# Patient Record
Sex: Male | Born: 1960 | Race: Black or African American | Hispanic: No | Marital: Married | Smoking: Former smoker
Health system: Southern US, Community
[De-identification: ages and names within clinical notes are randomized; demographics above are authoritative.]

## PROBLEM LIST (undated history)

## (undated) ENCOUNTER — Ambulatory Visit: Payer: BLUE CROSS/BLUE SHIELD | Admitting: Interventional Cardiology

## (undated) DIAGNOSIS — E7800 Pure hypercholesterolemia, unspecified: Secondary | ICD-10-CM

## (undated) DIAGNOSIS — K5900 Constipation, unspecified: Secondary | ICD-10-CM

## (undated) DIAGNOSIS — K635 Polyp of colon: Secondary | ICD-10-CM

## (undated) DIAGNOSIS — E119 Type 2 diabetes mellitus without complications: Secondary | ICD-10-CM

## (undated) DIAGNOSIS — I10 Essential (primary) hypertension: Secondary | ICD-10-CM

## (undated) HISTORY — PX: COLONOSCOPY: SHX174

## (undated) HISTORY — DX: Type 2 diabetes mellitus without complications: E11.9

## (undated) HISTORY — PX: CIRCUMCISION: SUR203

## (undated) HISTORY — DX: Essential (primary) hypertension: I10

---

## 2012-09-04 ENCOUNTER — Ambulatory Visit (INDEPENDENT_AMBULATORY_CARE_PROVIDER_SITE_OTHER): Payer: PRIVATE HEALTH INSURANCE | Admitting: Physician Assistant

## 2012-09-04 ENCOUNTER — Encounter: Payer: Self-pay | Admitting: Physician Assistant

## 2012-09-04 VITALS — BP 130/82 | HR 97 | Temp 97.3°F | Wt 152.0 lb

## 2012-09-04 DIAGNOSIS — E119 Type 2 diabetes mellitus without complications: Secondary | ICD-10-CM | POA: Insufficient documentation

## 2012-09-04 DIAGNOSIS — I10 Essential (primary) hypertension: Secondary | ICD-10-CM | POA: Insufficient documentation

## 2012-09-04 DIAGNOSIS — L0291 Cutaneous abscess, unspecified: Secondary | ICD-10-CM

## 2012-09-04 DIAGNOSIS — L0390 Cellulitis, unspecified: Secondary | ICD-10-CM

## 2012-09-04 MED ORDER — CEPHALEXIN 500 MG PO CAPS: 500.0000 mg | ORAL_CAPSULE | Freq: Two times a day (BID) | ORAL | Status: DC

## 2012-09-04 NOTE — Patient Instructions (Signed)
Wound Care Wound care helps prevent pain and infection.  You may need a tetanus shot if:  You cannot remember when you had your last tetanus shot.  You have never had a tetanus shot.  The injury broke your skin. If you need a tetanus shot and you choose not to have one, you may get tetanus. Sickness from tetanus can be serious. HOME CARE   Only take medicine as told by your doctor.  Clean the wound daily with mild soap and water.  Change any bandages (dressings) as told by your doctor.  Put medicated cream and a bandage on the wound as told by your doctor.  Change the bandage if it gets wet, dirty, or starts to smell.  Take showers. Do not take baths, swim, or do anything that puts your wound under water.  Rest and raise (elevate) the wound until the pain and puffiness (swelling) are better.  Keep all doctor visits as told. GET HELP RIGHT AWAY IF:   Yellowish-white fluid (pus) comes from the wound.  Medicine does not lessen your pain.  There is a red streak going away from the wound.  You have a fever. MAKE SURE YOU:   Understand these instructions.  Will watch your condition.  Will get help right away if you are not doing well or get worse. Document Released: 12/06/2007 Document Revised: 05/21/2011 Document Reviewed: 07/02/2010 ExitCare Patient Information 2014 [employer].  

## 2012-09-04 NOTE — Progress Notes (Signed)
Subjective:     Patient ID: Gene Wright, male   DOB: 1960/12/12, 52 y.o.   MRN: [id number]  HPI Pt with cyst behind the R ear Cyst has just started draining  Minimal pain to the site No hx of prev excision  Review of Systems  All other systems reviewed and are negative.       Objective:   Physical Exam  Nursing note and vitals reviewed. Draining lesion to the post R earlobe No pre-aur nodes No cerv nodes Ear canal nl     Assessment:     Abscess    Plan:     Wound care/S&S of infection reviewed Keflex rx F/U prn

## 2012-09-08 ENCOUNTER — Ambulatory Visit: Payer: Self-pay | Admitting: Physician Assistant

## 2012-12-23 ENCOUNTER — Ambulatory Visit: Payer: Managed Care, Other (non HMO) | Attending: Family Medicine | Admitting: Physical Therapy

## 2012-12-23 DIAGNOSIS — M545 Low back pain, unspecified: Secondary | ICD-10-CM | POA: Insufficient documentation

## 2012-12-23 DIAGNOSIS — R5381 Other malaise: Secondary | ICD-10-CM | POA: Insufficient documentation

## 2012-12-23 DIAGNOSIS — IMO0001 Reserved for inherently not codable concepts without codable children: Secondary | ICD-10-CM | POA: Insufficient documentation

## 2012-12-24 ENCOUNTER — Ambulatory Visit: Payer: Managed Care, Other (non HMO)

## 2012-12-31 ENCOUNTER — Ambulatory Visit: Payer: Managed Care, Other (non HMO)

## 2013-01-05 ENCOUNTER — Ambulatory Visit: Payer: Managed Care, Other (non HMO) | Admitting: Physical Therapy

## 2013-02-11 ENCOUNTER — Encounter (INDEPENDENT_AMBULATORY_CARE_PROVIDER_SITE_OTHER): Payer: Self-pay | Admitting: *Deleted

## 2013-02-26 ENCOUNTER — Encounter (INDEPENDENT_AMBULATORY_CARE_PROVIDER_SITE_OTHER): Payer: Self-pay | Admitting: Internal Medicine

## 2013-02-26 ENCOUNTER — Ambulatory Visit (INDEPENDENT_AMBULATORY_CARE_PROVIDER_SITE_OTHER): Payer: Managed Care, Other (non HMO) | Admitting: Internal Medicine

## 2013-02-26 VITALS — BP 122/84 | HR 84 | Temp 98.3°F | Ht 63.0 in | Wt 161.7 lb

## 2013-02-26 DIAGNOSIS — R1310 Dysphagia, unspecified: Secondary | ICD-10-CM

## 2013-02-26 DIAGNOSIS — K5900 Constipation, unspecified: Secondary | ICD-10-CM | POA: Insufficient documentation

## 2013-02-26 NOTE — Progress Notes (Addendum)
Subjective:     Patient ID: Gene Wright, male   DOB: August 19, 1960, 52 y.o.   MRN: [id number]  HPI Referred to our office by Dr Kipp Brood at [facility] in [place] for swallowing problems.   He also has epigastric tenderness and bloating.  He says he feels a 'lump" in his throat for a few weeks. No dysphagia.  No fever. He also has problems with constipation. He takes Miralax for constipation. He also drinks prune juice and coffee to have a BM daily. He says if he doesn't take the Miralax, prune juice and coffee, his abdomen will swell up "like a balloon". Symptoms for 9 months.  He also has some back pain, and has undergone physical therapy in [place] which did not help. No rectal bleeding or melena. He is scheduled for a colonoscopy with Dr. Elnoria Howard in [place]. Last colonoscopy was 4 yrs and he thinks he had a polyp. Appetite is okay. No weight loss.  No family hx colon cancer  Review of Systems CMP  No results found for this basename: na, k, cl, co2, glucose, bun, creatinine, calcium, prot, albumin, ast, alt, alkphos, bilitot, gfrnonaa, gfraa   Past Medical History  Diagnosis Date  . Hypertension   . Diabetes mellitus without complication    Current Outpatient Prescriptions on File Prior to Visit  Medication Sig Dispense Refill  . aspirin 81 MG tablet Take 81 mg by mouth daily.      Marland Kitchen lisinopril (PRINIVIL,ZESTRIL) 10 MG tablet Take 10 mg by mouth daily.      . metFORMIN (GLUCOPHAGE) 500 MG tablet Take 500 mg by mouth once.      . rosuvastatin (CRESTOR) 20 MG tablet Take 10 mg by mouth daily.        No current facility-administered medications on file prior to visit.   Past Medical History  Diagnosis Date  . Hypertension   . Diabetes mellitus without complication          Objective:   Physical Exam  Filed Vitals:   02/26/13 1100  BP: 122/84  Pulse: 84  Temp: 98.3 F (36.8 C)  Height: 5\' 3"  (1.6 m)  Weight: 161 lb 11.2 oz (73.347 kg)  Alert and oriented. Skin  warm and dry. Oral mucosa is moist.   . Sclera anicteric, conjunctivae is pink. Thyroid not enlarged. No cervical lymphadenopathy. Lungs clear. Heart regular rate and rhythm.  Abdomen is soft. Bowel sounds are positive. No hepatomegaly. No abdominal masses felt. No tenderness.  No edema to lower extremities. Patient is alert and oriented.      Assessment:   ?dysphagia. He says it feels like he has a lump in his espohagus when he swallows. Taking Nexium; Has not really seen any change. There really is no dysphagia. Constipation: Presently taking Miralax, Prune juice, and coffee which has helped.     Plan:    Pill esophagram.  Will get records for his last colonoscopy 4 yrs ago.

## 2013-02-26 NOTE — Patient Instructions (Signed)
Esophagram, Records from Dr. Elnoria Howard

## 2013-03-02 ENCOUNTER — Encounter (INDEPENDENT_AMBULATORY_CARE_PROVIDER_SITE_OTHER): Payer: Self-pay

## 2013-03-16 ENCOUNTER — Ambulatory Visit (HOSPITAL_COMMUNITY)
Admission: RE | Admit: 2013-03-16 | Discharge: 2013-03-16 | Disposition: A | Discharge status: Home / Self Care | Payer: PRIVATE HEALTH INSURANCE | Source: Ambulatory Visit | Attending: Internal Medicine | Admitting: Internal Medicine

## 2013-03-16 DIAGNOSIS — R1310 Dysphagia, unspecified: Secondary | ICD-10-CM | POA: Insufficient documentation

## 2013-03-17 ENCOUNTER — Telehealth (INDEPENDENT_AMBULATORY_CARE_PROVIDER_SITE_OTHER): Payer: Self-pay | Admitting: Internal Medicine

## 2013-03-18 NOTE — Telephone Encounter (Signed)
Opened in error

## 2013-06-16 ENCOUNTER — Encounter (INDEPENDENT_AMBULATORY_CARE_PROVIDER_SITE_OTHER): Payer: Self-pay | Admitting: *Deleted

## 2013-06-30 ENCOUNTER — Other Ambulatory Visit (INDEPENDENT_AMBULATORY_CARE_PROVIDER_SITE_OTHER): Payer: Self-pay | Admitting: *Deleted

## 2013-06-30 ENCOUNTER — Encounter (INDEPENDENT_AMBULATORY_CARE_PROVIDER_SITE_OTHER): Payer: Self-pay

## 2013-06-30 ENCOUNTER — Encounter (INDEPENDENT_AMBULATORY_CARE_PROVIDER_SITE_OTHER): Payer: Self-pay | Admitting: *Deleted

## 2013-06-30 ENCOUNTER — Telehealth (INDEPENDENT_AMBULATORY_CARE_PROVIDER_SITE_OTHER): Payer: Self-pay | Admitting: *Deleted

## 2013-06-30 DIAGNOSIS — Z800 Family history of malignant neoplasm of digestive organs: Secondary | ICD-10-CM

## 2013-06-30 DIAGNOSIS — Z1211 Encounter for screening for malignant neoplasm of colon: Secondary | ICD-10-CM

## 2013-06-30 DIAGNOSIS — Z86010 Personal history of colonic polyps: Secondary | ICD-10-CM

## 2013-06-30 MED ORDER — PEG-KCL-NACL-NASULF-NA ASC-C 100 G PO SOLR: 1.0000 | Freq: Once | ORAL | Status: DC

## 2013-06-30 NOTE — Telephone Encounter (Signed)
Patient needs movi prep 

## 2013-08-28 ENCOUNTER — Telehealth (INDEPENDENT_AMBULATORY_CARE_PROVIDER_SITE_OTHER): Payer: Self-pay | Admitting: *Deleted

## 2013-08-28 NOTE — Telephone Encounter (Signed)
  Procedure: tcs  Reason/Indication:  Hx polyps, fam hx colon ca  Has patient had this procedure before?  Yes, 5 years ago, scanned  If so, when, by whom and where?    Is there a family history of colon cancer?  Yes, father  Who?  What age when diagnosed?    Is patient diabetic?   yes      Does patient have prosthetic heart valve?  no  Do you have a pacemaker?  no  Has patient ever had endocarditis? no  Has patient had joint replacement within last 12 months?  no  Does patient tend to be constipated or take laxatives? ocassionally  Is patient on Coumadin, Plavix and/or Aspirin? yes  Medications: asa 81 mg prn, lisinopril 10 mg daily, metformin 500 mg daily (am)  Allergies: biaxin  Medication Adjustment: asa 2 days, hold metformin morning of procedure  Procedure date & time: 09/09/13 at 930

## 2013-08-31 ENCOUNTER — Encounter (HOSPITAL_COMMUNITY): Payer: Self-pay | Admitting: Pharmacy Technician

## 2013-08-31 NOTE — Telephone Encounter (Signed)
agree

## 2013-09-09 ENCOUNTER — Ambulatory Visit (HOSPITAL_COMMUNITY)
Admission: RE | Admit: 2013-09-09 | Discharge: 2013-09-09 | Disposition: A | Discharge status: Home / Self Care | Payer: PRIVATE HEALTH INSURANCE | Source: Ambulatory Visit | Attending: Internal Medicine | Admitting: Internal Medicine

## 2013-09-09 ENCOUNTER — Encounter (HOSPITAL_COMMUNITY): Payer: Self-pay | Admitting: *Deleted

## 2013-09-09 ENCOUNTER — Encounter (HOSPITAL_COMMUNITY)
Admission: RE | Disposition: A | Discharge status: Home / Self Care | Payer: Self-pay | Source: Ambulatory Visit | Attending: Internal Medicine

## 2013-09-09 DIAGNOSIS — Z7982 Long term (current) use of aspirin: Secondary | ICD-10-CM | POA: Diagnosis not present

## 2013-09-09 DIAGNOSIS — Z860100 Personal history of colon polyps, unspecified: Secondary | ICD-10-CM

## 2013-09-09 DIAGNOSIS — Z79899 Other long term (current) drug therapy: Secondary | ICD-10-CM | POA: Diagnosis not present

## 2013-09-09 DIAGNOSIS — K5900 Constipation, unspecified: Secondary | ICD-10-CM | POA: Diagnosis not present

## 2013-09-09 DIAGNOSIS — K5730 Diverticulosis of large intestine without perforation or abscess without bleeding: Secondary | ICD-10-CM

## 2013-09-09 DIAGNOSIS — Z09 Encounter for follow-up examination after completed treatment for conditions other than malignant neoplasm: Secondary | ICD-10-CM | POA: Diagnosis not present

## 2013-09-09 DIAGNOSIS — I10 Essential (primary) hypertension: Secondary | ICD-10-CM | POA: Insufficient documentation

## 2013-09-09 DIAGNOSIS — Z800 Family history of malignant neoplasm of digestive organs: Secondary | ICD-10-CM

## 2013-09-09 DIAGNOSIS — E119 Type 2 diabetes mellitus without complications: Secondary | ICD-10-CM | POA: Insufficient documentation

## 2013-09-09 DIAGNOSIS — F17200 Nicotine dependence, unspecified, uncomplicated: Secondary | ICD-10-CM | POA: Insufficient documentation

## 2013-09-09 DIAGNOSIS — R141 Gas pain: Secondary | ICD-10-CM | POA: Diagnosis not present

## 2013-09-09 DIAGNOSIS — R143 Flatulence: Secondary | ICD-10-CM

## 2013-09-09 DIAGNOSIS — E7800 Pure hypercholesterolemia, unspecified: Secondary | ICD-10-CM | POA: Insufficient documentation

## 2013-09-09 DIAGNOSIS — R142 Eructation: Secondary | ICD-10-CM

## 2013-09-09 DIAGNOSIS — K644 Residual hemorrhoidal skin tags: Secondary | ICD-10-CM

## 2013-09-09 HISTORY — DX: Polyp of colon: K63.5

## 2013-09-09 HISTORY — PX: COLONOSCOPY: SHX5424

## 2013-09-09 HISTORY — DX: Pure hypercholesterolemia, unspecified: E78.00

## 2013-09-09 HISTORY — DX: Constipation, unspecified: K59.00

## 2013-09-09 LAB — GLUCOSE, CAPILLARY: Glucose-Capillary: 109 mg/dL — ABNORMAL HIGH (ref 70–99)

## 2013-09-09 SURGERY — COLONOSCOPY
Anesthesia: Moderate Sedation

## 2013-09-09 MED ORDER — MEPERIDINE HCL 50 MG/ML IJ SOLN
INTRAMUSCULAR | Status: DC | PRN
  Administered 2013-09-09 (×2): 25 mg via INTRAVENOUS

## 2013-09-09 MED ORDER — MIDAZOLAM HCL 5 MG/5ML IJ SOLN
INTRAMUSCULAR | Status: AC
  Filled 2013-09-09: qty 10

## 2013-09-09 MED ORDER — MEPERIDINE HCL 50 MG/ML IJ SOLN
INTRAMUSCULAR | Status: AC
  Filled 2013-09-09: qty 1

## 2013-09-09 MED ORDER — STERILE WATER FOR IRRIGATION IR SOLN
Status: DC | PRN
  Administered 2013-09-09: 10:00:00

## 2013-09-09 MED ORDER — SODIUM CHLORIDE 0.9 % IV SOLN
INTRAVENOUS | Status: DC
  Administered 2013-09-09: 09:00:00 via INTRAVENOUS

## 2013-09-09 MED ORDER — MIDAZOLAM HCL 5 MG/5ML IJ SOLN
INTRAMUSCULAR | Status: DC | PRN
  Administered 2013-09-09: 2 mg via INTRAVENOUS
  Administered 2013-09-09: 1 mg via INTRAVENOUS
  Administered 2013-09-09: 2 mg via INTRAVENOUS

## 2013-09-09 NOTE — Op Note (Signed)
COLONOSCOPY PROCEDURE REPORT  PATIENT:  Gene RoachChristopher L Wright  MR#:  [id number] Birthdate:  [birth year], 53 y.o., male Endoscopist:  Dr. Malissa HippoNajeeb U. Rehman, MD Referred By:  Dr. Delorse LekBrent A. 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 He is returning for surveillance colonoscopy. He has chronic constipation responsive to MiraLax. Family history significant for colon carcinoma in paternal grandfather but he was in the 7170s at the time of diagnosis.  Informed Consent:  The procedure and risks were reviewed with the patient and informed consent was obtained.  Medications:  Demerol 50 mg IV Versed 5 mg IV  Description of procedure:  After a digital rectal exam was performed, that colonoscope was advanced from the anus through the rectum and colon to the area of the cecum, ileocecal valve and appendiceal orifice. The cecum was deeply intubated. These structures were well-seen and photographed for the record. From the level of the cecum and ileocecal valve, the scope was slowly and cautiously withdrawn. The mucosal surfaces were carefully surveyed utilizing scope tip to flexion to facilitate fold flattening as needed. The scope was pulled down into the rectum where a thorough exam including retroflexion was performed.  Findings:   Prep excellent. Few small diverticula noted at sigmoid colon. Normal rectal mucosa. Small hemorrhoids below the dentate line.   Therapeutic/Diagnostic Maneuvers Performed:   None  Complications:  none  Cecal Withdrawal Time:  10 minutes  Impression:  Examination performed to cecum. No evidence of recurrent polyps. Mild sigmoid colon diverticulosis. Small external hemorrhoids.  Recommendations:  Standard instructions given. High fiber diet. Consider next exam in 5 years.  REHMAN,NAJEEB U  09/09/2013 10:06 AM  CC: Dr.  Delorse LekBURNETT,BRENT A, MD & Dr. Bonnetta BarryNo ref. provider found

## 2013-09-09 NOTE — Discharge Instructions (Signed)
Resume usual medications and high fiber diet. No driving for 24 hours. Consider next exam in 5 years.  Colonoscopy, Care After Refer to this sheet in the next few weeks. These instructions provide you with information on caring for yourself after your procedure. Your health care provider may also give you more specific instructions. Your treatment has been planned according to current medical practices, but problems sometimes occur. Call your health care provider if you have any problems or questions after your procedure. WHAT TO EXPECT AFTER THE PROCEDURE  After your procedure, it is typical to have the following:  A small amount of blood in your stool.  Moderate amounts of gas and mild abdominal cramping or bloating. HOME CARE INSTRUCTIONS  Do not drive, operate machinery, or sign important documents for 24 hours.  You may shower and resume your regular physical activities, but move at a slower pace for the first 24 hours.  Take frequent rest periods for the first 24 hours.  Walk around or put a warm pack on your abdomen to help reduce abdominal cramping and bloating.  Drink enough fluids to keep your urine clear or pale yellow.  You may resume your normal diet as instructed by your health care provider. Avoid heavy or fried foods that are hard to digest.  Avoid drinking alcohol for 24 hours or as instructed by your health care provider.  Only take over-the-counter or prescription medicines as directed by your health care provider.  If a tissue sample (biopsy) was taken during your procedure:  Do not take aspirin or blood thinners for 7 days, or as instructed by your health care provider.  Do not drink alcohol for 7 days, or as instructed by your health care provider.  Eat soft foods for the first 24 hours. SEEK MEDICAL CARE IF: You have persistent spotting of blood in your stool 2-3 days after the procedure. SEEK IMMEDIATE MEDICAL CARE IF:  You have more than a small  spotting of blood in your stool.  You pass large blood clots in your stool.  Your abdomen is swollen (distended).  You have nausea or vomiting.  You have a fever.  You have increasing abdominal pain that is not relieved with medicine. Document Released: 10/11/2003 Document Revised: 12/17/2012 Document Reviewed: 11/03/2012 [facility] Patient Information 2015 [place], [place]. This information is not intended to replace advice given to you by your health care provider. Make sure you discuss any questions you have with your health care provider.  Diverticulosis Diverticulosis is the condition that develops when small pouches (diverticula) form in the wall of your colon. Your colon, or large intestine, is where water is absorbed and stool is formed. The pouches form when the inside layer of your colon pushes through weak spots in the outer layers of your colon. CAUSES  No one knows exactly what causes diverticulosis. RISK FACTORS  Being older than 50. Your risk for this condition increases with age. Diverticulosis is rare in people younger than 40 years. By age 76, almost everyone has it.  Eating a low-fiber diet.  Being frequently constipated.  Being overweight.  Not getting enough exercise.  Smoking.  Taking over-the-counter pain medicines, like aspirin and ibuprofen. SYMPTOMS  Most people with diverticulosis do not have symptoms. DIAGNOSIS  Because diverticulosis often has no symptoms, health care providers often discover the condition during an exam for other colon problems. In many cases, a health care provider will diagnose diverticulosis while using a flexible scope to examine the colon (colonoscopy). TREATMENT  If you have never developed an infection related to diverticulosis, you may not need treatment. If you have had an infection before, treatment may include:  Eating more fruits, vegetables, and grains.  Taking a fiber supplement.  Taking a live bacteria supplement  (probiotic).  Taking medicine to relax your colon. HOME CARE INSTRUCTIONS   Drink at least 6-8 glasses of water each day to prevent constipation.  Try not to strain when you have a bowel movement.  Keep all follow-up appointments. If you have had an infection before:  Increase the fiber in your diet as directed by your health care provider or dietitian.  Take a dietary fiber supplement if your health care provider approves.  Only take medicines as directed by your health care provider. SEEK MEDICAL CARE IF:   You have abdominal pain.  You have bloating.  You have cramps.  You have not gone to the bathroom in 3 days. SEEK IMMEDIATE MEDICAL CARE IF:   Your pain gets worse.  Yourbloating becomes very bad.  You have a fever or chills, and your symptoms suddenly get worse.  You begin vomiting.  You have bowel movements that are bloody or black. MAKE SURE YOU:  Understand these instructions.  Will watch your condition.  Will get help right away if you are not doing well or get worse. Document Released: 11/24/2003 Document Revised: 03/03/2013 Document Reviewed: 01/21/2013 [facility] Patient Information 2015 [place], [place]. This information is not intended to replace advice given to you by your health care provider. Make sure you discuss any questions you have with your health care provider.  High-Fiber Diet Fiber is found in fruits, vegetables, and grains. A high-fiber diet encourages the addition of more whole grains, legumes, fruits, and vegetables in your diet. The recommended amount of fiber for adult males is 38 g per day. For adult females, it is 25 g per day. Pregnant and lactating women should get 28 g of fiber per day. If you have a digestive or bowel problem, ask your caregiver for advice before adding high-fiber foods to your diet. Eat a variety of high-fiber foods instead of only a select few type of foods.  PURPOSE  To increase stool bulk.  To make bowel  movements more regular to prevent constipation.  To lower cholesterol.  To prevent overeating. WHEN IS THIS DIET USED?  It may be used if you have constipation and hemorrhoids.  It may be used if you have uncomplicated diverticulosis (intestine condition) and irritable bowel syndrome.  It may be used if you need help with weight management.  It may be used if you want to add it to your diet as a protective measure against atherosclerosis, diabetes, and cancer. SOURCES OF FIBER  Whole-grain breads and cereals.  Fruits, such as apples, oranges, bananas, berries, prunes, and pears.  Vegetables, such as green peas, carrots, sweet potatoes, beets, broccoli, cabbage, spinach, and artichokes.  Legumes, such split peas, soy, lentils.  Almonds. FIBER CONTENT IN FOODS Starches and Grains / Dietary Fiber (g)  Cheerios, 1 cup / 3 g  Corn Flakes cereal, 1 cup / 0.7 g  Rice crispy treat cereal, 1 cup / 0.3 g  Instant oatmeal (cooked),  cup / 2 g  Frosted wheat cereal, 1 cup / 5.1 g  Brown, long-grain rice (cooked), 1 cup / 3.5 g  White, long-grain rice (cooked), 1 cup / 0.6 g  Enriched macaroni (cooked), 1 cup / 2.5 g Legumes / Dietary Fiber (g)  Baked beans (canned,  plain, or vegetarian),  cup / 5.2 g  Kidney beans (canned),  cup / 6.8 g  Pinto beans (cooked),  cup / 5.5 g Breads and Crackers / Dietary Fiber (g)  Plain or honey graham crackers, 2 squares / 0.7 g  Saltine crackers, 3 squares / 0.3 g  Plain, salted pretzels, 10 pieces / 1.8 g  Whole-wheat bread, 1 slice / 1.9 g  White bread, 1 slice / 0.7 g  Raisin bread, 1 slice / 1.2 g  Plain bagel, 3 oz / 2 g  Flour tortilla, 1 oz / 0.9 g  Corn tortilla, 1 small / 1.5 g  Hamburger or hotdog bun, 1 small / 0.9 g Fruits / Dietary Fiber (g)  Apple with skin, 1 medium / 4.4 g  Sweetened applesauce,  cup / 1.5 g  Banana,  medium / 1.5 g  Grapes, 10 grapes / 0.4 g  Orange, 1 small / 2.3  g  Raisin, 1.5 oz / 1.6 g  Melon, 1 cup / 1.4 g Vegetables / Dietary Fiber (g)  Green beans (canned),  cup / 1.3 g  Carrots (cooked),  cup / 2.3 g  Broccoli (cooked),  cup / 2.8 g  Peas (cooked),  cup / 4.4 g  Mashed potatoes,  cup / 1.6 g  Lettuce, 1 cup / 0.5 g  Corn (canned),  cup / 1.6 g  Tomato,  cup / 1.1 g Document Released: 02/26/2005 Document Revised: 08/28/2011 Document Reviewed: 05/31/2011 Exit[employer] Patient Information 2015 [place], [place]. This information is not intended to replace advice given to you by your health care provider. Make sure you discuss any questions you have with your health care provider.

## 2013-09-09 NOTE — H&P (Signed)
Gene RoachChristopher L Hepp is an 53 y.o. male.   Chief Complaint: patient is here for colonoscopy. HPI: patient is [age] year old African male who had small sessile serrated adenoma removed in April 2010 and is here for surveillance colonoscopy. He denies rectal bleeding. He has constipation and bloating. His bowels move as long as he takes MiraLax . Family history significant for colon carcinoma in paternal grandfather but he was in the 8670s at the time of diagnosis. Patient does not have first degree relative with CRC or polyps.  Past Medical History  Diagnosis Date  . Hypertension   . Diabetes mellitus without complication   . Hypercholesteremia   . Constipation   . Colon polyps     Past Surgical History  Procedure Laterality Date  . Circumcision    . Colonoscopy      Family History  Problem Relation Age of Onset  . Coronary artery disease Father   . Diabetes Maternal Grandmother   . Diabetes Maternal Grandfather   . Diabetes Paternal Grandmother   . Cancer Paternal Grandfather     prostate  . Diabetes Paternal Grandfather   . Colon cancer Neg Hx    Social History:  reports that he has been smoking Cigars.  He does not have any smokeless tobacco history on file. He reports that he drinks about 8.4 ounces of alcohol per week. He reports that he does not use illicit drugs.  Allergies:  Allergies  Allergen Reactions  . Biaxin [Clarithromycin] Hives and Itching    Medications Prior to Admission  Medication Sig Dispense Refill  . aspirin 81 MG tablet Take 81 mg by mouth daily.      Marland Kitchen. glimepiride (AMARYL) 2 MG tablet Take 2 mg by mouth daily with breakfast.      . lisinopril (PRINIVIL,ZESTRIL) 10 MG tablet Take 10 mg by mouth daily.      . metFORMIN (GLUCOPHAGE) 500 MG tablet Take 500 mg by mouth daily.       . polyethylene glycol (MIRALAX / GLYCOLAX) packet Take 17 g by mouth daily.      . rosuvastatin (CRESTOR) 20 MG tablet Take 10 mg by mouth daily.         Results for orders  placed during the hospital encounter of 09/09/13 (from the past 48 hour(s))  GLUCOSE, CAPILLARY     Status: Abnormal   Collection Time    09/09/13  9:01 AM      Result Value Ref Range   Glucose-Capillary 109 (*) 70 - 99 mg/dL   Comment 1 Documented in Chart     No results found.  ROS  Blood pressure 135/92, pulse 86, temperature 98.5 F (36.9 C), temperature source Oral, resp. rate 18, height 5\' 4"  (1.626 m), weight 159 lb (72.122 kg), SpO2 94.00%. Physical Exam  Constitutional: He appears well-developed and well-nourished.  HENT:  Mouth/Throat: Oropharynx is clear and moist.  Eyes: Conjunctivae are normal. No scleral icterus.  Neck: No thyromegaly present.  Cardiovascular: Normal rate, regular rhythm and normal heart sounds.   Respiratory: Effort normal and breath sounds normal.  GI: Soft. He exhibits no distension and no mass. There is no tenderness.  Small umbilical hernia which is completely reducible  Musculoskeletal: He exhibits no edema.  Lymphadenopathy:    He has no cervical adenopathy.  Neurological: He is alert.  Skin: Skin is warm and dry.     Assessment/Plan History of sessile serrated adenoma or polyp. Surveillance colonoscopy.  Analicia Skibinski U 09/09/2013, 9:39 AM

## 2013-09-16 ENCOUNTER — Encounter (HOSPITAL_COMMUNITY): Payer: Self-pay | Admitting: Internal Medicine

## 2014-04-15 ENCOUNTER — Encounter: Payer: Self-pay | Admitting: Family Medicine

## 2014-04-15 ENCOUNTER — Ambulatory Visit (INDEPENDENT_AMBULATORY_CARE_PROVIDER_SITE_OTHER): Payer: PRIVATE HEALTH INSURANCE | Admitting: Family Medicine

## 2014-04-15 VITALS — BP 143/91 | HR 124 | Temp 97.4°F | Ht 63.0 in | Wt 166.2 lb

## 2014-04-15 DIAGNOSIS — N481 Balanitis: Secondary | ICD-10-CM

## 2014-04-15 MED ORDER — NYSTATIN 100000 UNIT/GM EX CREA: 1.0000 "application " | TOPICAL_CREAM | Freq: Two times a day (BID) | CUTANEOUS | Status: DC

## 2014-04-15 MED ORDER — FLUCONAZOLE 150 MG PO TABS: 150.0000 mg | ORAL_TABLET | Freq: Once | ORAL | Status: DC

## 2014-04-15 NOTE — Progress Notes (Signed)
   Subjective:    Patient ID: Gene Wright, male    DOB: 04/26/1960, 54 y.o.   MRN: [id number]  HPI Patient is c/o rash on his penis  Review of Systems  Constitutional: Negative for fever.  HENT: Negative for ear pain.   Eyes: Negative for discharge.  Respiratory: Negative for cough.   Cardiovascular: Negative for chest pain.  Gastrointestinal: Negative for abdominal distention.  Endocrine: Negative for polyuria.  Genitourinary: Negative for difficulty urinating.  Musculoskeletal: Negative for gait problem and neck pain.  Skin: Negative for color change and rash.  Neurological: Negative for speech difficulty and headaches.  Psychiatric/Behavioral: Negative for agitation.       Objective:    BP 143/91 mmHg  Pulse 124  Temp(Src) 97.4 F (36.3 C) (Oral)  Ht 5\' 3"  (1.6 m)  Wt 166 lb 3.2 oz (75.388 kg)  BMI 29.45 kg/m2 Physical Exam  Constitutional: He is oriented to person, place, and time. He appears well-developed and well-nourished.  HENT:  Head: Normocephalic and atraumatic.  Mouth/Throat: Oropharynx is clear and moist.  Eyes: Pupils are equal, round, and reactive to light.  Neck: Normal range of motion. Neck supple.  Cardiovascular: Normal rate and regular rhythm.   No murmur heard. Pulmonary/Chest: Effort normal and breath sounds normal.  Abdominal: Soft. Bowel sounds are normal. There is no tenderness.  Neurological: He is alert and oriented to person, place, and time.  Skin: Skin is warm and dry.  Psychiatric: He has a normal mood and affect.          Assessment & Plan:     ICD-9-CM ICD-10-CM   1. Balanitis 607.1 N48.1 fluconazole (DIFLUCAN) 150 MG tablet     nystatin cream (MYCOSTATIN)     No Follow-up on file.  Deatra CanterWilliam J Herson Prichard FNP

## 2014-04-20 ENCOUNTER — Ambulatory Visit (INDEPENDENT_AMBULATORY_CARE_PROVIDER_SITE_OTHER): Payer: PRIVATE HEALTH INSURANCE | Admitting: Family Medicine

## 2014-04-20 ENCOUNTER — Encounter: Payer: Self-pay | Admitting: Family Medicine

## 2014-04-20 VITALS — BP 143/93 | HR 107 | Temp 97.4°F | Ht 63.0 in | Wt 167.2 lb

## 2014-04-20 DIAGNOSIS — N481 Balanitis: Secondary | ICD-10-CM

## 2014-04-20 MED ORDER — FLUCONAZOLE 150 MG PO TABS: 150.0000 mg | ORAL_TABLET | Freq: Once | ORAL | Status: DC

## 2014-04-20 NOTE — Progress Notes (Signed)
   Subjective:    Patient ID: Gene RoachChristopher L Mccarthy, male    DOB: 07/17/1960, 54 y.o.   MRN: [id number]  HPI Patient is having rash on penis.  Review of Systems  Constitutional: Negative for fever.  HENT: Negative for ear pain.   Eyes: Negative for discharge.  Respiratory: Negative for cough.   Cardiovascular: Negative for chest pain.  Gastrointestinal: Negative for abdominal distention.  Endocrine: Negative for polyuria.  Genitourinary: Negative for difficulty urinating.  Musculoskeletal: Negative for gait problem and neck pain.  Skin: Negative for color change and rash.  Neurological: Negative for speech difficulty and headaches.  Psychiatric/Behavioral: Negative for agitation.       Objective:    BP 143/93 mmHg  Pulse 107  Temp(Src) 97.4 F (36.3 C) (Oral)  Ht 5\' 3"  (1.6 m)  Wt 167 lb 3.2 oz (75.841 kg)  BMI 29.63 kg/m2 Physical Exam  GU - Penis glans normal and foreskin with erythema       Assessment & Plan:     ICD-9-CM ICD-10-CM   1. Balanitis 607.1 N48.1 fluconazole (DIFLUCAN) 150 MG tablet     No Follow-up on file.  Deatra CanterWilliam J Shalamar Plourde FNP

## 2014-10-15 ENCOUNTER — Ambulatory Visit (INDEPENDENT_AMBULATORY_CARE_PROVIDER_SITE_OTHER): Payer: PRIVATE HEALTH INSURANCE | Admitting: Family Medicine

## 2014-10-15 ENCOUNTER — Encounter: Payer: Self-pay | Admitting: Family Medicine

## 2014-10-15 VITALS — BP 145/92 | HR 96 | Temp 97.8°F | Ht 63.0 in | Wt 163.4 lb

## 2014-10-15 DIAGNOSIS — J40 Bronchitis, not specified as acute or chronic: Secondary | ICD-10-CM

## 2014-10-15 DIAGNOSIS — J329 Chronic sinusitis, unspecified: Secondary | ICD-10-CM | POA: Diagnosis not present

## 2014-10-15 MED ORDER — LEVOFLOXACIN 500 MG PO TABS: 500.0000 mg | ORAL_TABLET | Freq: Every day | ORAL | Status: DC

## 2014-10-15 MED ORDER — PSEUDOEPHEDRINE-GUAIFENESIN ER 120-1200 MG PO TB12: 1.0000 | ORAL_TABLET | Freq: Two times a day (BID) | ORAL | Status: DC

## 2014-10-15 NOTE — Patient Instructions (Signed)
Take levofloxacin daily for 1 week. Please note that it can lead to low glucose when combined with glimepiride. Use only one half of a glimepiride daily. If your sugar drops below 52 year old need to drop that completely and contact your diabetes physician.  Mucinex D taken twice daily can help relieve the congestion and loosen the cough.

## 2014-10-15 NOTE — Progress Notes (Signed)
Subjective:  Patient ID: Gene Wright, male    DOB: 07/18/1960  Age: 54 y.o. MRN: [id number]  CC: URI   HPI [name] presents for Symptoms include congestion, facial pain, nasal congestion, no  fever, green productive cough, post nasal drip and sinus pressure with no fever, chills, night sweats or weight loss. Onset of symptoms was a week ago, gradually worsening since that time. Patient denies shortness of breath. Energy has been poor History Moris has a past medical history of Hypertension; Diabetes mellitus without complication; Hypercholesteremia; Constipation; and Colon polyps.   He has past surgical history that includes Circumcision; Colonoscopy; and Colonoscopy (N/A, 09/09/2013).   His family history includes Cancer in his paternal grandfather; Coronary artery disease in his father; Diabetes in his maternal grandfather, maternal grandmother, paternal grandfather, and paternal grandmother. There is no history of Colon cancer.He reports that he has been smoking Cigars.  He does not have any smokeless tobacco history on file. He reports that he drinks about 8.4 oz of alcohol per week. He reports that he does not use illicit drugs.  Outpatient Prescriptions Prior to Visit  Medication Sig Dispense Refill  . aspirin 81 MG tablet Take 81 mg by mouth daily.    Marland Kitchen glimepiride (AMARYL) 2 MG tablet Take 2 mg by mouth daily with breakfast.    . lisinopril (PRINIVIL,ZESTRIL) 10 MG tablet Take 10 mg by mouth daily.    . metFORMIN (GLUCOPHAGE) 500 MG tablet Take 500 mg by mouth daily.     . rosuvastatin (CRESTOR) 20 MG tablet Take 10 mg by mouth daily.     Marland Kitchen nystatin cream (MYCOSTATIN) Apply 1 application topically 2 (two) times daily. (Patient not taking: Reported on 10/15/2014) 30 g 0  . fluconazole (DIFLUCAN) 150 MG tablet Take 1 tablet (150 mg total) by mouth once. (Patient not taking: Reported on 10/15/2014) 5 tablet 1   No facility-administered medications prior to visit.     ROS Review of Systems  Constitutional: Negative for fever, chills, activity change and appetite change.  HENT: Positive for congestion, postnasal drip, rhinorrhea and sinus pressure. Negative for ear discharge, ear pain, hearing loss, nosebleeds, sneezing and trouble swallowing.   Respiratory: Positive for cough and chest tightness. Negative for shortness of breath.   Cardiovascular: Negative for palpitations.  Skin: Negative for rash.    Objective:  BP 145/92 mmHg  Pulse 96  Temp(Src) 97.8 F (36.6 C) (Oral)  Ht 5\' 3"  (1.6 m)  Wt 163 lb 6.4 oz (74.118 kg)  BMI 28.95 kg/m2  BP Readings from Last 3 Encounters:  10/15/14 145/92  04/20/14 143/93  04/15/14 143/91    Wt Readings from Last 3 Encounters:  10/15/14 163 lb 6.4 oz (74.118 kg)  04/20/14 167 lb 3.2 oz (75.841 kg)  04/15/14 166 lb 3.2 oz (75.388 kg)     Physical Exam  No results found for: HGBA1C  No results found for: WBC, HGB, HCT, PLT, GLUCOSE, CHOL, TRIG, HDL, LDLDIRECT, LDLCALC, ALT, AST, NA, K, CL, CREATININE, BUN, CO2, TSH, PSA, INR, GLUF, HGBA1C, MICROALBUR  No results found.  Assessment & Plan:   Tynan was seen today for uri.  Diagnoses and all orders for this visit:  Sinobronchitis Orders: -     levofloxacin (LEVAQUIN) 500 MG tablet; Take 1 tablet (500 mg total) by mouth daily. -     Pseudoephedrine-Guaifenesin [phone] MG TB12; Take 1 tablet by mouth 2 (two) times daily. For congeestion   I have discontinued Mr. Moultrie fluconazole.  I am also having him start on levofloxacin and Pseudoephedrine-Guaifenesin. Additionally, I am having him maintain his rosuvastatin, metFORMIN, lisinopril, aspirin, glimepiride, and nystatin cream.  Meds ordered this encounter  Medications  . levofloxacin (LEVAQUIN) 500 MG tablet    Sig: Take 1 tablet (500 mg total) by mouth daily.    Dispense:  7 tablet    Refill:  0  . Pseudoephedrine-Guaifenesin [phone] MG TB12    Sig: Take 1 tablet by mouth 2  (two) times daily. For congeestion    Dispense:  20 each    Refill:  0     Follow-up: Return if symptoms worsen or fail to improve.  Mechele Claude, M.D.

## 2015-01-11 ENCOUNTER — Telehealth: Payer: Self-pay | Admitting: Family Medicine

## 2015-03-24 LAB — HEMOGLOBIN A1C: Hemoglobin A1C: 5.8

## 2015-03-31 LAB — MICROALBUMIN, URINE: Microalb, Ur: 80

## 2015-04-04 IMAGING — RF DG ESOPHAGUS
7 of 11 series · 12 of 24 positions shown · non-contrast
Comparison: None.

FLUOROSCOPY TIME:  0 min 42 seconds

CLINICAL DATA: Dysphagia.  Sensation of something in the throat.

EXAM:
ESOPHOGRAM / BARIUM SWALLOW / BARIUM TABLET STUDY
TECHNIQUE: Combined double contrast and single contrast examination performed
using effervescent crystals, thick barium liquid, and thin barium
liquid. The patient was observed with fluoroscopy swallowing a 13mm
barium sulphate tablet.

[Series 1: run · 1 of 5 slices shown (1 of 7)]
[im 3/5]
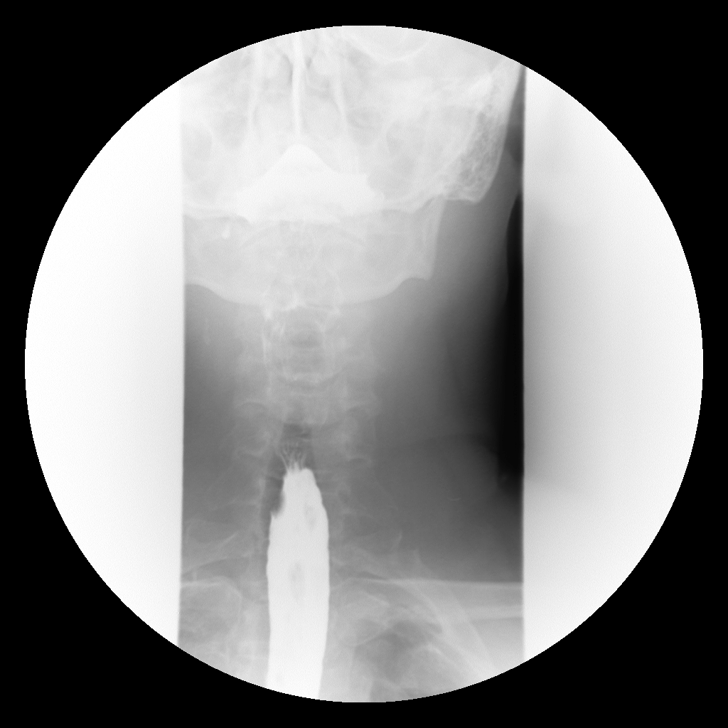

[Series 2: run · 2 of 5 slices shown (2 of 7)]
[im 1/5]
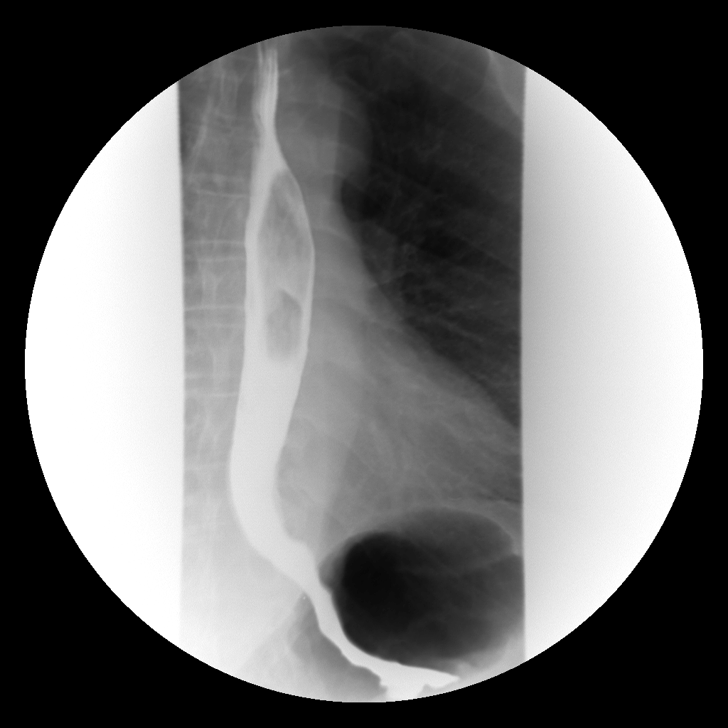
[im 5/5]
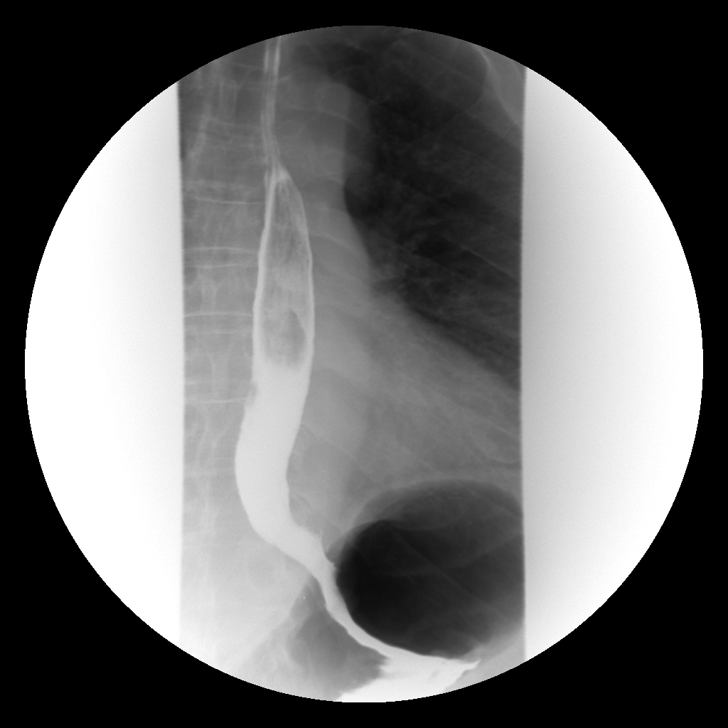

[Series 4: run · 3 of 6 slices shown (3 of 7)]
[im 1/6]
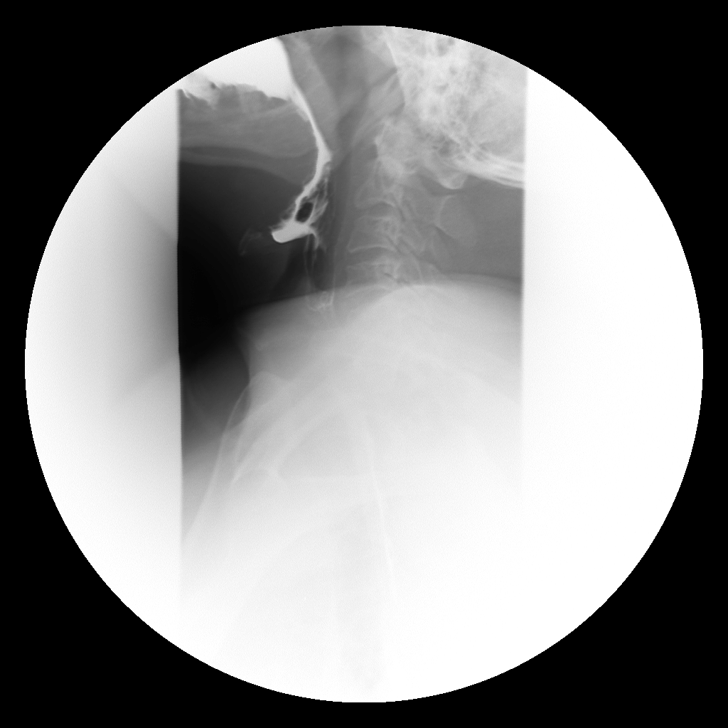
[im 3/6]
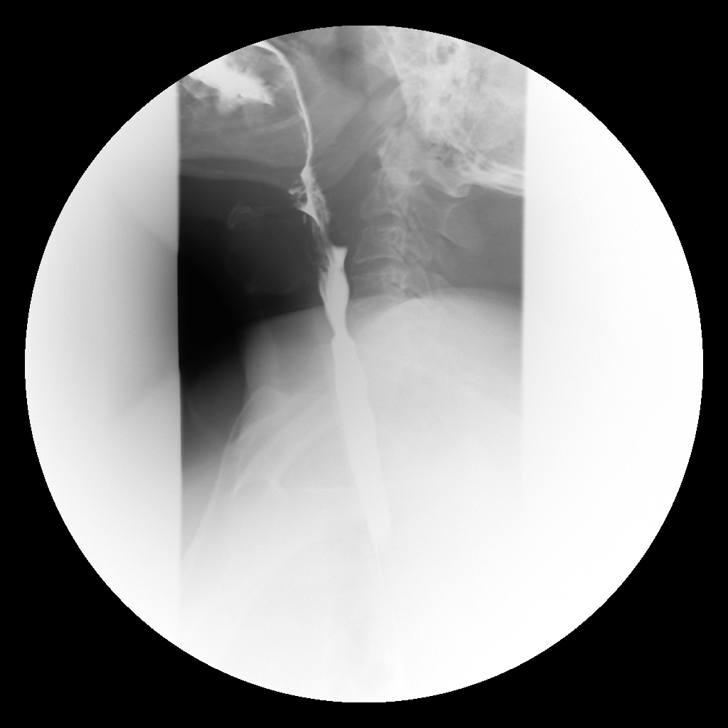
[im 6/6]
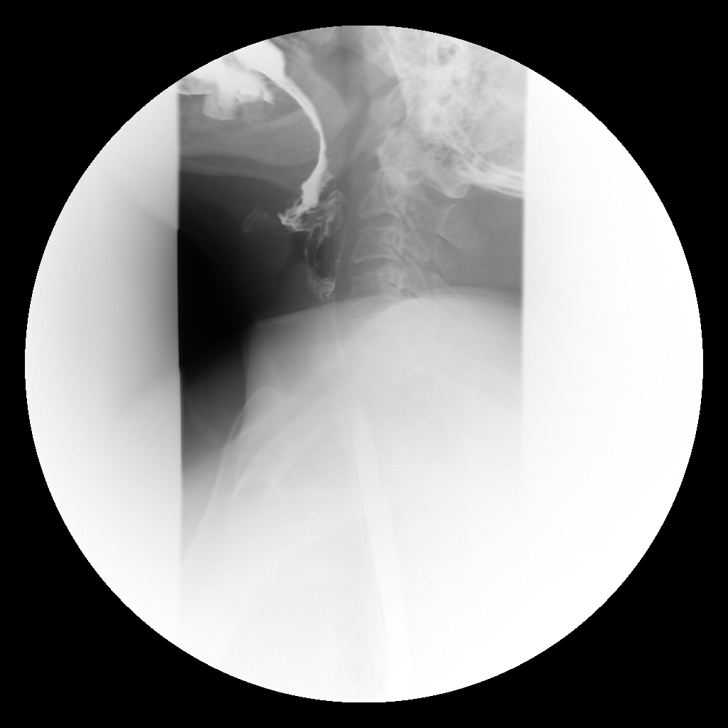

[Series 5: run · 3 of 8 slices shown (4 of 7)]
[im 2/8]
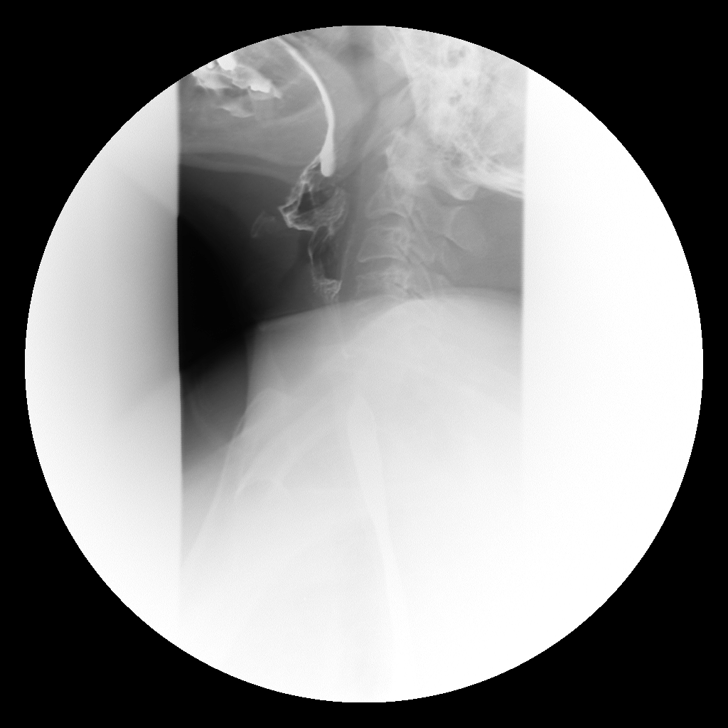
[im 5/8]
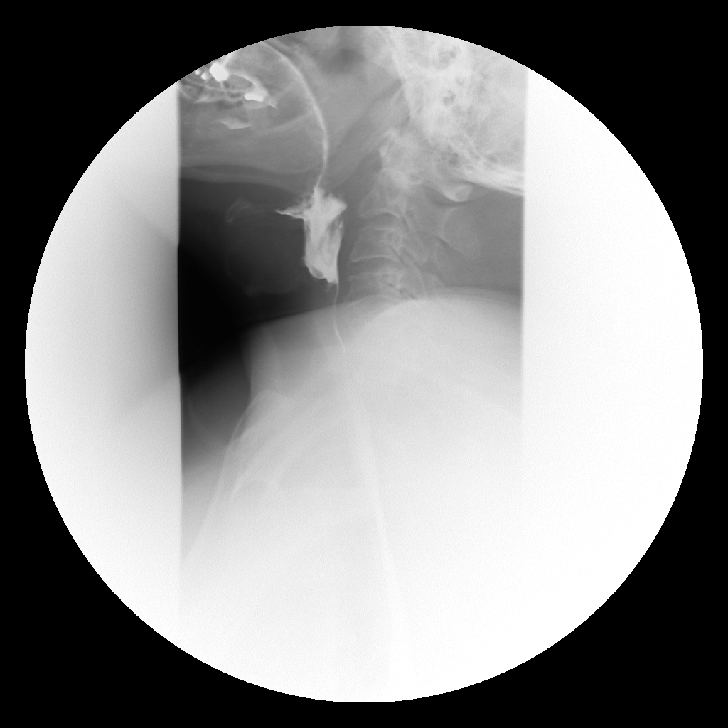
[im 8/8]
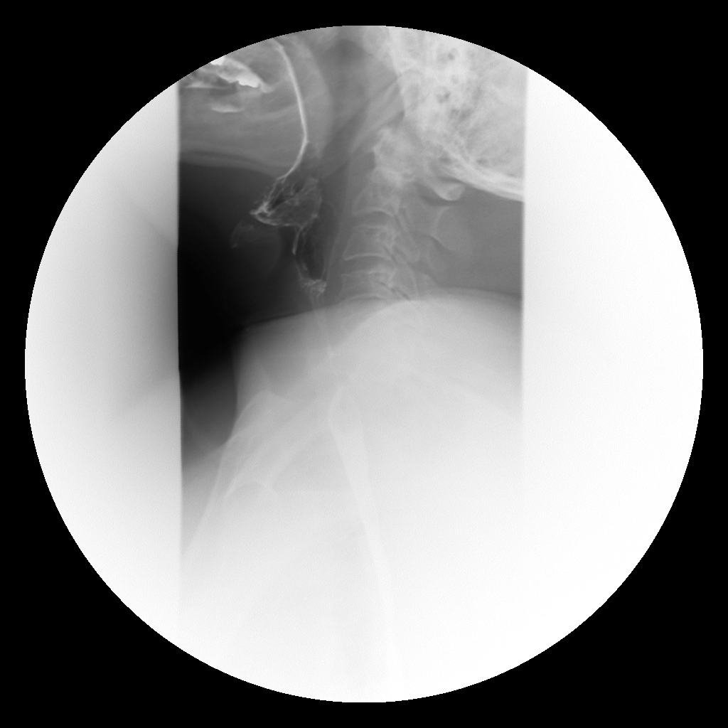

[Series 7: run · 1 of 1 slices shown (5 of 7)]
[im 1/1]
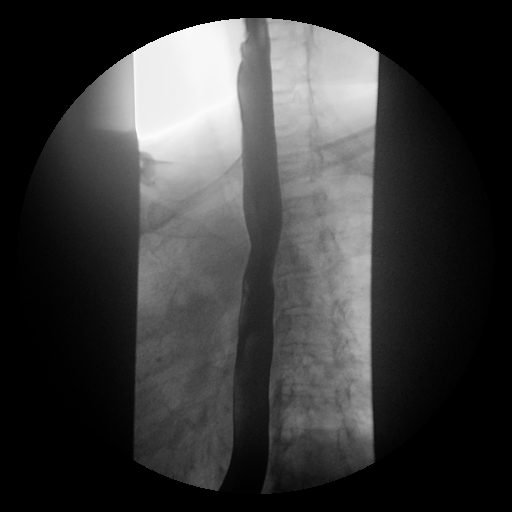

[Series 11: run · 1 of 1 slices shown (6 of 7)]
[im 1/1]
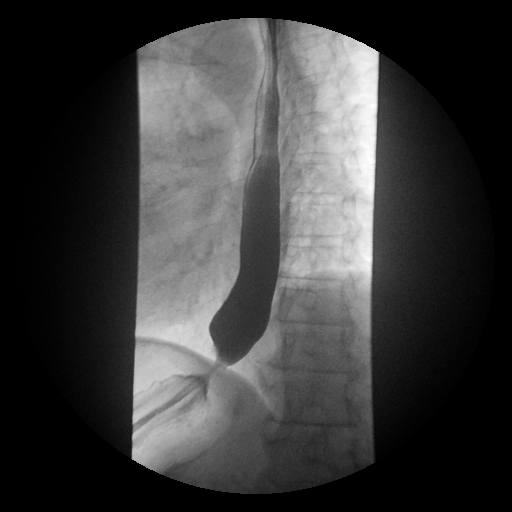

[Series 15: run · 1 of 1 slices shown (7 of 7)]
[im 1/1]
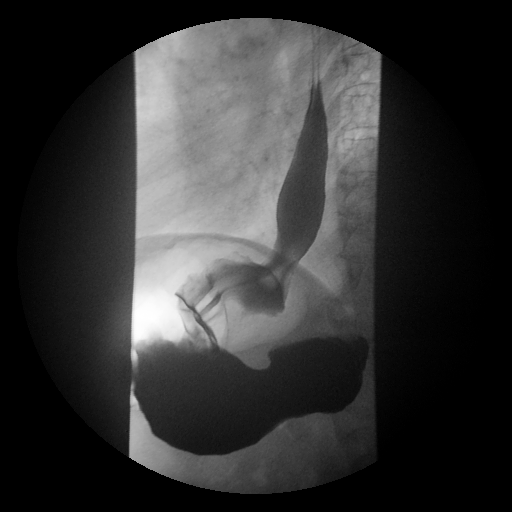

[12 of 24 positions shown; findings below may reference images not displayed]

FINDINGS: The oropharyngeal swallowing mechanisms are normal. Valleculae and
piriform sinuses are normal.

The mucosa and motility of the esophagus are normal. No hiatal
hernia or gastroesophageal reflux. No mass lesion or stricture. A 13
mm barium tablet passed immediately from the mouth to the stomach
with no delay.
IMPRESSION: Normal barium esophagram.

## 2015-05-30 ENCOUNTER — Ambulatory Visit (INDEPENDENT_AMBULATORY_CARE_PROVIDER_SITE_OTHER): Payer: PRIVATE HEALTH INSURANCE | Admitting: Family Medicine

## 2015-05-30 ENCOUNTER — Encounter: Payer: Self-pay | Admitting: Family Medicine

## 2015-05-30 VITALS — BP 140/82 | HR 90 | Temp 97.6°F | Ht 63.0 in | Wt 160.0 lb

## 2015-05-30 DIAGNOSIS — J209 Acute bronchitis, unspecified: Secondary | ICD-10-CM

## 2015-05-30 MED ORDER — AMOXICILLIN-POT CLAVULANATE 875-125 MG PO TABS: 1.0000 | ORAL_TABLET | Freq: Two times a day (BID) | ORAL | Status: DC

## 2015-05-30 MED ORDER — PREDNISONE 50 MG PO TABS: 50.0000 mg | ORAL_TABLET | Freq: Every day | ORAL | Status: DC

## 2015-05-30 NOTE — Patient Instructions (Addendum)
   Great to meet you!  Come back with any concerns  Finish all antibiotics  Try flonase as well, 2 sprays per nostril  once daily   Acute Bronchitis Bronchitis is when the airways that extend from the windpipe into the lungs get red, puffy, and painful (inflamed). Bronchitis often causes thick spit (mucus) to develop. This leads to a cough. A cough is the most common symptom of bronchitis. In acute bronchitis, the condition usually begins suddenly and goes away over time (usually in 2 weeks). Smoking, allergies, and asthma can make bronchitis worse. Repeated episodes of bronchitis may cause more lung problems. HOME CARE  Rest.  Drink enough fluids to keep your pee (urine) clear or pale yellow (unless you need to limit fluids as told by your doctor).  Only take over-the-counter or prescription medicines as told by your doctor.  Avoid smoking and secondhand smoke. These can make bronchitis worse. If you are a smoker, think about using nicotine gum or skin patches. Quitting smoking will help your lungs heal faster.  Reduce the chance of getting bronchitis again by:  Washing your hands often.  Avoiding people with cold symptoms.  Trying not to touch your hands to your mouth, nose, or eyes.  Follow up with your doctor as told. GET HELP IF: Your symptoms do not improve after 1 week of treatment. Symptoms include:  Cough.  Fever.  Coughing up thick spit.  Body aches.  Chest congestion.  Chills.  Shortness of breath.  Sore throat. GET HELP RIGHT AWAY IF:   You have an increased fever.  You have chills.  You have severe shortness of breath.  You have bloody thick spit (sputum).  You throw up (vomit) often.  You lose too much body fluid (dehydration).  You have a severe headache.  You faint. MAKE SURE YOU:   Understand these instructions.  Will watch your condition.  Will get help right away if you are not doing well or get worse.   This information is  not intended to replace advice given to you by your health care provider. Make sure you discuss any questions you have with your health care provider.   Document Released: 08/15/2007 Document Revised: 10/29/2012 Document Reviewed: 08/19/2012 Elsevier Interactive Patient Education [employer]2016 [employer] Inc.

## 2015-05-30 NOTE — Progress Notes (Signed)
   HPI  Patient presents today here for an acute visit.  Patient explains her last 3 weeks she's had cough, congestion, and seems to be getting worse. He is a smoker. He has diabetes, last A1c was 5.7, [facility].  His symptoms began with malaise and subjective fever, similar to the clinic, that improved and he had a persistent cough. Over the last week he has had one episode of subjective fever He had a mild rash which were started on his posterior neck and then into improved.  He is tolerating food and fluids normally, no dyspnea, and has no chest pain  PMH: Smoking status noted ROS: Per HPI  Objective: BP 140/82 mmHg  Pulse 90  Temp(Src) 97.6 F (36.4 C) (Oral)  Ht 5\' 3"  (1.6 m)  Wt 160 lb (72.576 kg)  BMI 28.35 kg/m2 Gen: NAD, alert, cooperative with exam HEENT: NCAT, TMs normal bilaterally, nares were very swollen turbinates bilaterally, oropharynx moist and clear CV: RRR, good S1/S2, no murmur Resp: CTABL, no wheezes, non-labored Ext: No edema, warm Neuro: Alert and oriented, No gross deficits  Assessment and plan:  # Acute bronchitis Treat with Augmentin given course and duration of symptoms, as well as possible underlying COPD and smoking. Short course of prednisone, cautioned about blood sugars Supportive care discussed Return to clinic with any concerns or worsening symptoms    Meds ordered this encounter  Medications  . amoxicillin-clavulanate (AUGMENTIN) 875-125 MG tablet    Sig: Take 1 tablet by mouth 2 (two) times daily.    Dispense:  20 tablet    Refill:  0  . predniSONE (DELTASONE) 50 MG tablet    Sig: Take 1 tablet (50 mg total) by mouth daily with breakfast.    Dispense:  5 tablet    Refill:  0    Murtis SinkSam Chucky Homes, MD [facility] 05/30/2015, 11:18 AM

## 2015-06-28 ENCOUNTER — Encounter: Payer: Self-pay | Admitting: Family Medicine

## 2015-06-28 ENCOUNTER — Ambulatory Visit (INDEPENDENT_AMBULATORY_CARE_PROVIDER_SITE_OTHER): Payer: PRIVATE HEALTH INSURANCE | Admitting: Family Medicine

## 2015-06-28 ENCOUNTER — Encounter (INDEPENDENT_AMBULATORY_CARE_PROVIDER_SITE_OTHER): Payer: Self-pay

## 2015-06-28 VITALS — BP 159/96 | HR 93 | Temp 97.6°F | Ht 63.0 in | Wt 163.0 lb

## 2015-06-28 DIAGNOSIS — R05 Cough: Secondary | ICD-10-CM | POA: Diagnosis not present

## 2015-06-28 DIAGNOSIS — R053 Chronic cough: Secondary | ICD-10-CM

## 2015-06-28 DIAGNOSIS — R042 Hemoptysis: Secondary | ICD-10-CM

## 2015-06-28 MED ORDER — CETIRIZINE HCL 10 MG PO TABS: 10.0000 mg | ORAL_TABLET | Freq: Every day | ORAL | Status: DC

## 2015-06-28 MED ORDER — MONTELUKAST SODIUM 10 MG PO TABS: 10.0000 mg | ORAL_TABLET | Freq: Every day | ORAL | Status: DC

## 2015-06-28 NOTE — Progress Notes (Signed)
   HPI  Patient presents today here with persistent cough.  Patient explains that over the last few weeks since we last saw each other he's had slight improvement cough that has continued symptoms. He describes cough, nasal congestion. He states that the cough improved slightly on prednisone but then returned completely. He developed a neck rash that's dark and itchy at times after starting the medications.  He is very well controlled diabetes managed by another physician, he's been taken off of his pills due to very good control.  He is very concerned about his cough because he can taste blood after his coughing at night, this is been going on for months. He has over a 30 year history of at least one pack per day smoking  PMH: Smoking status noted ROS: Per HPI  Objective: BP 159/96 mmHg  Pulse 93  Temp(Src) 97.6 F (36.4 C) (Oral)  Ht 5\' 3"  (1.6 m)  Wt 163 lb (73.936 kg)  BMI 28.88 kg/m2 Gen: NAD, alert, cooperative with exam HEENT: NCAT, oropharynx clear, nares with significant bilateral swollen turbinates, TMs normal bilaterally CV: RRR, good S1/S2, no murmur Resp: CTABL, no wheezes, non-labored Ext: No edema, warm Neuro: Alert and oriented, No gross deficits  Assessment and plan:  # Chronic cough, hemoptysis I will go ahead and consider this tasting blood hemoptysis because it so persistent and he has increased risk with a long history of smoking CT scan with Labs Start Zyrtec and Singulair for severe allergic rhinitis and likely postnasal drip Return to clinic if worsening or does not improve as expected     Orders Placed This Encounter  Procedures  . CT Chest W Contrast    Standing Status: Future     Number of Occurrences:      Standing Expiration Date: 08/27/2016    Order Specific Question:  If indicated for the ordered procedure, I authorize the administration of contrast media per Radiology protocol    Answer:  Yes    Order Specific Question:  Reason for  Exam (SYMPTOM  OR DIAGNOSIS REQUIRED)    Answer:  hemoptysis,m smoker, eval for neoplasia    Order Specific Question:  Preferred imaging location?    [facility]  . Basic metabolic panel    Order Specific Question:  Has the patient fasted?    Answer:  No    Meds ordered this encounter  Medications  . cetirizine (ZYRTEC) 10 MG tablet    Sig: Take 1 tablet (10 mg total) by mouth daily.    Dispense:  30 tablet    Refill:  11  . montelukast (SINGULAIR) 10 MG tablet    Sig: Take 1 tablet (10 mg total) by mouth at bedtime.    Dispense:  30 tablet    Refill:  3    Murtis SinkSam Bradshaw, MD [facility] 06/28/2015, 4:28 PM

## 2015-06-28 NOTE — Patient Instructions (Signed)
Great to see you!  Start zyrtec daily (not zyrtec D), the store brand is ok Start singulair as well  I am trying to arrange a CT scan of your chest, they will call you to arrange this.   Try to stop smoking, consider calling 1800 Quit now

## 2015-07-06 ENCOUNTER — Ambulatory Visit (HOSPITAL_COMMUNITY): Payer: PRIVATE HEALTH INSURANCE

## 2015-09-15 ENCOUNTER — Encounter: Payer: Self-pay | Admitting: *Deleted

## 2016-09-21 NOTE — Progress Notes (Signed)
In the order it says that patient was a no show

## 2016-09-24 ENCOUNTER — Telehealth: Payer: Self-pay

## 2016-09-24 NOTE — Telephone Encounter (Signed)
Appreciate FYI and documentation,.  He was told it was ordered due to hemoptysis   Murtis SinkSam Bradshaw, MD [facility] 09/24/2016, 12:07 PM

## 2016-09-24 NOTE — Telephone Encounter (Signed)
Called patient to see about scheduling another CT because he no showed for CT Chest 4/17  He states that [place] now and he would talk to his Dr about scheduling this for him there.

## 2018-09-16 ENCOUNTER — Encounter (INDEPENDENT_AMBULATORY_CARE_PROVIDER_SITE_OTHER): Payer: Self-pay | Admitting: *Deleted

## 2022-02-23 ENCOUNTER — Inpatient Hospital Stay (HOSPITAL_COMMUNITY): Payer: BLUE CROSS/BLUE SHIELD

## 2022-02-23 ENCOUNTER — Encounter (HOSPITAL_COMMUNITY)
Admission: EM | Disposition: A | Discharge status: Home / Self Care | Payer: Self-pay | Source: Other Acute Inpatient Hospital | Attending: Interventional Cardiology

## 2022-02-23 ENCOUNTER — Other Ambulatory Visit: Payer: Self-pay

## 2022-02-23 ENCOUNTER — Telehealth (HOSPITAL_COMMUNITY): Payer: Self-pay

## 2022-02-23 ENCOUNTER — Other Ambulatory Visit (HOSPITAL_COMMUNITY): Payer: Self-pay

## 2022-02-23 ENCOUNTER — Inpatient Hospital Stay (HOSPITAL_COMMUNITY)
Admission: EM | Admit: 2022-02-23 | Discharge: 2022-02-25 | DRG: 321 | Disposition: A | Discharge status: Home / Self Care | Payer: BLUE CROSS/BLUE SHIELD | Source: Other Acute Inpatient Hospital | Attending: Interventional Cardiology | Admitting: Interventional Cardiology

## 2022-02-23 ENCOUNTER — Encounter (HOSPITAL_COMMUNITY): Payer: Self-pay | Admitting: Interventional Cardiology

## 2022-02-23 DIAGNOSIS — I10 Essential (primary) hypertension: Secondary | ICD-10-CM | POA: Diagnosis present

## 2022-02-23 DIAGNOSIS — Z79899 Other long term (current) drug therapy: Secondary | ICD-10-CM

## 2022-02-23 DIAGNOSIS — I739 Peripheral vascular disease, unspecified: Secondary | ICD-10-CM

## 2022-02-23 DIAGNOSIS — N1831 Chronic kidney disease, stage 3a: Secondary | ICD-10-CM | POA: Diagnosis present

## 2022-02-23 DIAGNOSIS — I2121 ST elevation (STEMI) myocardial infarction involving left circumflex coronary artery: Secondary | ICD-10-CM

## 2022-02-23 DIAGNOSIS — Z7982 Long term (current) use of aspirin: Secondary | ICD-10-CM | POA: Diagnosis not present

## 2022-02-23 DIAGNOSIS — I130 Hypertensive heart and chronic kidney disease with heart failure and stage 1 through stage 4 chronic kidney disease, or unspecified chronic kidney disease: Secondary | ICD-10-CM | POA: Diagnosis present

## 2022-02-23 DIAGNOSIS — F1010 Alcohol abuse, uncomplicated: Secondary | ICD-10-CM | POA: Diagnosis present

## 2022-02-23 DIAGNOSIS — J449 Chronic obstructive pulmonary disease, unspecified: Secondary | ICD-10-CM | POA: Diagnosis present

## 2022-02-23 DIAGNOSIS — E7800 Pure hypercholesterolemia, unspecified: Secondary | ICD-10-CM | POA: Diagnosis present

## 2022-02-23 DIAGNOSIS — I5031 Acute diastolic (congestive) heart failure: Secondary | ICD-10-CM | POA: Diagnosis present

## 2022-02-23 DIAGNOSIS — I2510 Atherosclerotic heart disease of native coronary artery without angina pectoris: Secondary | ICD-10-CM | POA: Diagnosis present

## 2022-02-23 DIAGNOSIS — R9431 Abnormal electrocardiogram [ECG] [EKG]: Secondary | ICD-10-CM

## 2022-02-23 DIAGNOSIS — Z955 Presence of coronary angioplasty implant and graft: Secondary | ICD-10-CM

## 2022-02-23 DIAGNOSIS — Q631 Lobulated, fused and horseshoe kidney: Secondary | ICD-10-CM

## 2022-02-23 DIAGNOSIS — Z881 Allergy status to other antibiotic agents status: Secondary | ICD-10-CM | POA: Diagnosis not present

## 2022-02-23 DIAGNOSIS — F17210 Nicotine dependence, cigarettes, uncomplicated: Secondary | ICD-10-CM | POA: Diagnosis present

## 2022-02-23 DIAGNOSIS — Z8249 Family history of ischemic heart disease and other diseases of the circulatory system: Secondary | ICD-10-CM | POA: Diagnosis not present

## 2022-02-23 DIAGNOSIS — I2119 ST elevation (STEMI) myocardial infarction involving other coronary artery of inferior wall: Principal | ICD-10-CM

## 2022-02-23 DIAGNOSIS — E119 Type 2 diabetes mellitus without complications: Secondary | ICD-10-CM

## 2022-02-23 DIAGNOSIS — Z833 Family history of diabetes mellitus: Secondary | ICD-10-CM

## 2022-02-23 DIAGNOSIS — Z720 Tobacco use: Secondary | ICD-10-CM | POA: Diagnosis not present

## 2022-02-23 DIAGNOSIS — E1151 Type 2 diabetes mellitus with diabetic peripheral angiopathy without gangrene: Secondary | ICD-10-CM | POA: Diagnosis present

## 2022-02-23 DIAGNOSIS — G4700 Insomnia, unspecified: Secondary | ICD-10-CM | POA: Diagnosis present

## 2022-02-23 DIAGNOSIS — Z716 Tobacco abuse counseling: Secondary | ICD-10-CM

## 2022-02-23 DIAGNOSIS — I5022 Chronic systolic (congestive) heart failure: Secondary | ICD-10-CM | POA: Insufficient documentation

## 2022-02-23 DIAGNOSIS — E1122 Type 2 diabetes mellitus with diabetic chronic kidney disease: Secondary | ICD-10-CM | POA: Diagnosis present

## 2022-02-23 DIAGNOSIS — E785 Hyperlipidemia, unspecified: Secondary | ICD-10-CM

## 2022-02-23 HISTORY — PX: LEFT HEART CATH AND CORONARY ANGIOGRAPHY: CATH118249

## 2022-02-23 HISTORY — PX: CORONARY STENT INTERVENTION: CATH118234

## 2022-02-23 LAB — CBC
HCT: 45.3 % (ref 39.0–52.0)
Hemoglobin: 15.6 g/dL (ref 13.0–17.0)
MCH: 32.8 pg (ref 26.0–34.0)
MCHC: 34.4 g/dL (ref 30.0–36.0)
MCV: 95.4 fL (ref 80.0–100.0)
Platelets: 249 10*3/uL (ref 150–400)
RBC: 4.75 MIL/uL (ref 4.22–5.81)
RDW: 12.9 % (ref 11.5–15.5)
WBC: 9.8 10*3/uL (ref 4.0–10.5)
nRBC: 0 % (ref 0.0–0.2)

## 2022-02-23 LAB — ECHOCARDIOGRAM COMPLETE
Area-P 1/2: 2.99 cm2
S' Lateral: 3.5 cm

## 2022-02-23 LAB — POCT ACTIVATED CLOTTING TIME
Activated Clotting Time: 260 seconds
Activated Clotting Time: 325 seconds
Activated Clotting Time: 428 seconds
Activated Clotting Time: 596 seconds

## 2022-02-23 LAB — BASIC METABOLIC PANEL
Anion gap: 6 (ref 5–15)
BUN: 10 mg/dL (ref 6–20)
CO2: 20 mmol/L — ABNORMAL LOW (ref 22–32)
Calcium: 8.4 mg/dL — ABNORMAL LOW (ref 8.9–10.3)
Chloride: 111 mmol/L (ref 98–111)
Creatinine, Ser: 1.02 mg/dL (ref 0.61–1.24)
GFR, Estimated: >60 mL/min (ref >60)
Glucose, Bld: 129 mg/dL — ABNORMAL HIGH (ref 70–99)
Potassium: 3.8 mmol/L (ref 3.5–5.1)
Sodium: 137 mmol/L (ref 135–145)

## 2022-02-23 LAB — CREATININE, SERUM
Creatinine, Ser: 0.88 mg/dL (ref 0.61–1.24)
GFR, Estimated: >60 mL/min (ref >60)

## 2022-02-23 LAB — GLUCOSE, CAPILLARY
Glucose-Capillary: 127 mg/dL — ABNORMAL HIGH (ref 70–99)
Glucose-Capillary: 94 mg/dL (ref 70–99)

## 2022-02-23 LAB — MRSA NEXT GEN BY PCR, NASAL: MRSA by PCR Next Gen: NOT DETECTED

## 2022-02-23 LAB — MAGNESIUM: Magnesium: 2.1 mg/dL (ref 1.7–2.4)

## 2022-02-23 SURGERY — LEFT HEART CATH AND CORONARY ANGIOGRAPHY
Anesthesia: LOCAL

## 2022-02-23 MED ORDER — SODIUM CHLORIDE 0.9% FLUSH: 3.0000 mL | INTRAVENOUS | Status: DC | PRN

## 2022-02-23 MED ORDER — SODIUM CHLORIDE 0.9% FLUSH
3.0000 mL | Freq: Two times a day (BID) | INTRAVENOUS | Status: DC
  Administered 2022-02-23 – 2022-02-25 (×4): 3 mL via INTRAVENOUS

## 2022-02-23 MED ORDER — CHLORHEXIDINE GLUCONATE CLOTH 2 % EX PADS
6.0000 | MEDICATED_PAD | Freq: Every day | CUTANEOUS | Status: DC
  Administered 2022-02-24 – 2022-02-25 (×2): 6 via TOPICAL

## 2022-02-23 MED ORDER — ACETAMINOPHEN 325 MG PO TABS
650.0000 mg | ORAL_TABLET | ORAL | Status: DC | PRN
  Administered 2022-02-23: 650 mg via ORAL
  Filled 2022-02-23: qty 2

## 2022-02-23 MED ORDER — FENTANYL CITRATE (PF) 100 MCG/2ML IJ SOLN
INTRAMUSCULAR | Status: DC | PRN
  Administered 2022-02-23: 25 ug via INTRAVENOUS

## 2022-02-23 MED ORDER — HEPARIN SODIUM (PORCINE) 1000 UNIT/ML IJ SOLN
INTRAMUSCULAR | Status: AC
  Filled 2022-02-23: qty 10

## 2022-02-23 MED ORDER — METOPROLOL TARTRATE 25 MG PO TABS
25.0000 mg | ORAL_TABLET | Freq: Two times a day (BID) | ORAL | Status: DC
  Administered 2022-02-23 – 2022-02-25 (×5): 25 mg via ORAL
  Filled 2022-02-23 (×5): qty 1

## 2022-02-23 MED ORDER — OXYCODONE HCL 5 MG PO TABS: 5.0000 mg | ORAL_TABLET | ORAL | Status: DC | PRN

## 2022-02-23 MED ORDER — ATROPINE SULFATE 1 MG/10ML IJ SOSY
PREFILLED_SYRINGE | INTRAMUSCULAR | Status: DC | PRN
  Administered 2022-02-23: 1 mg via INTRAVENOUS

## 2022-02-23 MED ORDER — SODIUM CHLORIDE 0.9 % IV SOLN: 250.0000 mL | INTRAVENOUS | Status: DC | PRN

## 2022-02-23 MED ORDER — LIDOCAINE HCL (PF) 1 % IJ SOLN
INTRAMUSCULAR | Status: DC | PRN
  Administered 2022-02-23: 2 mL

## 2022-02-23 MED ORDER — VERAPAMIL HCL 2.5 MG/ML IV SOLN
INTRAVENOUS | Status: AC
  Filled 2022-02-23: qty 2

## 2022-02-23 MED ORDER — HEPARIN SODIUM (PORCINE) 1000 UNIT/ML IJ SOLN
INTRAMUSCULAR | Status: DC | PRN
  Administered 2022-02-23: 7000 [IU] via INTRAVENOUS
  Administered 2022-02-23: 2000 [IU] via INTRAVENOUS

## 2022-02-23 MED ORDER — ONDANSETRON HCL 4 MG/2ML IJ SOLN: 4.0000 mg | Freq: Four times a day (QID) | INTRAMUSCULAR | Status: DC | PRN

## 2022-02-23 MED ORDER — ASPIRIN 81 MG PO CHEW
81.0000 mg | CHEWABLE_TABLET | Freq: Every day | ORAL | Status: DC
  Administered 2022-02-24 – 2022-02-25 (×2): 81 mg via ORAL
  Filled 2022-02-23 (×2): qty 1

## 2022-02-23 MED ORDER — SODIUM CHLORIDE 0.9 % IV SOLN
INTRAVENOUS | Status: DC | PRN
  Administered 2022-02-23: 250 mL/h via INTRAVENOUS

## 2022-02-23 MED ORDER — LIDOCAINE HCL (PF) 1 % IJ SOLN
INTRAMUSCULAR | Status: AC
  Filled 2022-02-23: qty 30

## 2022-02-23 MED ORDER — TICAGRELOR 90 MG PO TABS
90.0000 mg | ORAL_TABLET | Freq: Two times a day (BID) | ORAL | Status: DC
  Administered 2022-02-23 – 2022-02-25 (×4): 90 mg via ORAL
  Filled 2022-02-23 (×4): qty 1

## 2022-02-23 MED ORDER — FENTANYL CITRATE (PF) 100 MCG/2ML IJ SOLN
INTRAMUSCULAR | Status: AC
  Filled 2022-02-23: qty 2

## 2022-02-23 MED ORDER — HEPARIN (PORCINE) IN NACL 1000-0.9 UT/500ML-% IV SOLN
INTRAVENOUS | Status: DC | PRN
  Administered 2022-02-23 (×2): 500 mL

## 2022-02-23 MED ORDER — SODIUM CHLORIDE 0.9 % IV BOLUS
INTRAVENOUS | Status: DC | PRN
  Administered 2022-02-23: 250 mL via INTRAVENOUS

## 2022-02-23 MED ORDER — TICAGRELOR 90 MG PO TABS
ORAL_TABLET | ORAL | Status: AC
  Filled 2022-02-23: qty 2

## 2022-02-23 MED ORDER — ORAL CARE MOUTH RINSE: 15.0000 mL | OROMUCOSAL | Status: DC | PRN

## 2022-02-23 MED ORDER — VERAPAMIL HCL 2.5 MG/ML IV SOLN
INTRAVENOUS | Status: DC | PRN
  Administered 2022-02-23: 10 mL via INTRA_ARTERIAL

## 2022-02-23 MED ORDER — MIDAZOLAM HCL 2 MG/2ML IJ SOLN
INTRAMUSCULAR | Status: DC | PRN
  Administered 2022-02-23: 1 mg via INTRAVENOUS

## 2022-02-23 MED ORDER — HEPARIN SODIUM (PORCINE) 5000 UNIT/ML IJ SOLN
5000.0000 [IU] | Freq: Three times a day (TID) | INTRAMUSCULAR | Status: DC
  Administered 2022-02-23 – 2022-02-25 (×5): 5000 [IU] via SUBCUTANEOUS
  Filled 2022-02-23 (×5): qty 1

## 2022-02-23 MED ORDER — LABETALOL HCL 5 MG/ML IV SOLN: 10.0000 mg | INTRAVENOUS | Status: AC | PRN

## 2022-02-23 MED ORDER — SODIUM CHLORIDE 0.9 % IV SOLN: INTRAVENOUS | Status: AC

## 2022-02-23 MED ORDER — IOHEXOL 350 MG/ML SOLN
INTRAVENOUS | Status: DC | PRN
  Administered 2022-02-23: 150 mL

## 2022-02-23 MED ORDER — TICAGRELOR 90 MG PO TABS
ORAL_TABLET | ORAL | Status: DC | PRN
  Administered 2022-02-23: 180 mg via ORAL

## 2022-02-23 MED ORDER — MIDAZOLAM HCL 2 MG/2ML IJ SOLN
INTRAMUSCULAR | Status: AC
  Filled 2022-02-23: qty 2

## 2022-02-23 MED ORDER — ATORVASTATIN CALCIUM 80 MG PO TABS
80.0000 mg | ORAL_TABLET | Freq: Every day | ORAL | Status: DC
  Administered 2022-02-23 – 2022-02-25 (×3): 80 mg via ORAL
  Filled 2022-02-23 (×3): qty 1

## 2022-02-23 MED ORDER — HYDRALAZINE HCL 20 MG/ML IJ SOLN: 10.0000 mg | INTRAMUSCULAR | Status: AC | PRN

## 2022-02-23 MED ORDER — HEPARIN (PORCINE) IN NACL 1000-0.9 UT/500ML-% IV SOLN
INTRAVENOUS | Status: AC
  Filled 2022-02-23: qty 1000

## 2022-02-23 SURGICAL SUPPLY — 21 items
BALLN EMERGE MR 2.0X12 (BALLOONS) ×1
BALLN EMERGE MR 2.0X8 (BALLOONS) ×1
BALLN ~~LOC~~ EMERGE MR 2.5X12 (BALLOONS) ×1
BALLOON EMERGE MR 2.0X12 (BALLOONS) IMPLANT
BALLOON EMERGE MR 2.0X8 (BALLOONS) IMPLANT
BALLOON ~~LOC~~ EMERGE MR 2.5X12 (BALLOONS) IMPLANT
CATH 5FR JL3.5 JR4 ANG PIG MP (CATHETERS) IMPLANT
CATH VISTA GUIDE 6FR XB3.5 (CATHETERS) IMPLANT
DEVICE RAD COMP TR BAND LRG (VASCULAR PRODUCTS) IMPLANT
GLIDESHEATH SLEND A-KIT 6F 22G (SHEATH) IMPLANT
GUIDEWIRE INQWIRE 1.5J.035X260 (WIRE) IMPLANT
INQWIRE 1.5J .035X260CM (WIRE) ×1
KIT ENCORE 26 ADVANTAGE (KITS) IMPLANT
KIT HEART LEFT (KITS) ×1 IMPLANT
PACK CARDIAC CATHETERIZATION (CUSTOM PROCEDURE TRAY) ×1 IMPLANT
STENT ONYX FRONTIER 2.0X12 (Permanent Stent) IMPLANT
STENT SYNERGY XD 2.25X20 (Permanent Stent) IMPLANT
SYNERGY XD 2.25X20 (Permanent Stent) ×1 IMPLANT
TRANSDUCER W/STOPCOCK (MISCELLANEOUS) ×1 IMPLANT
TUBING CIL FLEX 10 FLL-RA (TUBING) ×1 IMPLANT
WIRE ASAHI PROWATER 180CM (WIRE) IMPLANT

## 2022-02-23 NOTE — TOC Benefit Eligibility Note (Signed)
Patient Product/process development scientist completed.    The patient is currently admitted and upon discharge could be taking Brilinta 90mg .  The current 30 day co-pay is $415.34.   The patient is insured through , [employer] Pharmacy Patient Advocate Specialist [facility] Patient Advocate Team Direct Number: [phone] Fax: [phone]

## 2022-02-23 NOTE — H&P (Signed)
Delete.

## 2022-02-23 NOTE — Progress Notes (Signed)
  Echocardiogram 2D Echocardiogram has been performed.  Delcie Roch 02/23/2022, 3:19 PM

## 2022-02-23 NOTE — Telephone Encounter (Signed)
Pharmacy Patient Advocate Encounter  Insurance verification completed.    The patient is insured through [employer]   The patient is currently admitted and ran test claims for the following: Brilinta.  Copays and coinsurance results were relayed to Inpatient clinical team.

## 2022-02-23 NOTE — H&P (Addendum)
The patient has been seen in conjunction with Perlie Gold, PAC. All aspects of care have been considered and discussed. The patient has been personally interviewed, examined, and all clinical data has been reviewed.  The patient admits to a several day h/o recurring chest pain. Last PM it started and has persisted. States this discomfort is better now than last night. Of note, he has intermittent claudication in left leg > right leg. ECG c/w inferior STEMI. Exam is abnormal due to non-palpable pedal pulsesProcedure and risk discussed and accepted by the patient.  Critical Care Time 35 minutes  Cardiology Admission History and Physical   Patient ID: Gene Wright MRN: [id number]; DOB: [birth year]   Admission date: 02/23/2022  PCP:  Elenora Gamma, MD (Inactive)   [facility] Providers Cardiologist:  None        Chief Complaint:  chest pain  Patient Profile:   Gene Wright is a 61 y.o. male with history of diabetes mellitus type 2, chart evidence of poor control with hemoglobin A1c 9.2 in May 2022, former smoker, alcohol abuse, insomnia, horseshoe kidney, memory loss, COPD, primary hypertension, hyperlipidemia, and stage IIIa CKD. Patient had chest pain that started 12 hours ago and is being seen 02/23/2022 for the evaluation of inferior ST elevation MI, transported from [facility].   History of Present Illness:   Yesterday evening, patient reported that he began to experience intermittent chest pain. With persistent symptoms he came to the emergency room at [facility] where EKG documented inferior ST elevation in 2, 3, and aVF. ACS protocols initiated. Appears that patient was initially going to be transported to [facility] but due to air transport delay, [facility] was contacted, we accepted instead.  Patient cardiac history notable January 2022 TTE for Bruce protocol exercise treadmill test 04/26/2020. He  exercised for 3 minutes to a heart rate of 121 and extreme blood pressure elevation 210/99 mmHg.  No ischemic change.  Left leg pain with exercise. Test was stopped due to severe hypertensive blood pressure response.    Family cardiac history for CAD with father having myocardial infarction.   Past Medical History:  Diagnosis Date   Colon polyps    Constipation    Diabetes mellitus without complication (HCC)    Hypercholesteremia    Hypertension     Past Surgical History:  Procedure Laterality Date   CIRCUMCISION     COLONOSCOPY     COLONOSCOPY N/A 09/09/2013   Procedure: COLONOSCOPY;  Surgeon: Malissa Hippo, MD;  Location: [facility];  Service: Endoscopy;  Laterality: N/A;  930     Medications Prior to Admission: Prior to Admission medications   Medication Sig Start Date End Date Taking? Authorizing Provider  aspirin 81 MG tablet Take 81 mg by mouth daily.    [provider]  cetirizine (ZYRTEC) 10 MG tablet Take 1 tablet (10 mg total) by mouth daily. 06/28/15   Elenora Gamma, MD  lisinopril (PRINIVIL,ZESTRIL) 10 MG tablet Take 10 mg by mouth daily.    [provider]  montelukast (SINGULAIR) 10 MG tablet Take 1 tablet (10 mg total) by mouth at bedtime. 06/28/15   Elenora Gamma, MD  rosuvastatin (CRESTOR) 20 MG tablet Take 10 mg by mouth daily.     [provider]     Allergies:    Allergies  Allergen Reactions   Biaxin [Clarithromycin] Hives and Itching    Social History:   Social History  Socioeconomic History   Marital status: Married    Spouse name: Not on file   Number of children: Not on file   Years of education: Not on file   Highest education level: Not on file  Occupational History   Not on file  Tobacco Use   Smoking status: Every Day    Packs/day: 3.00    Years: 32.00    Total pack years: 96.00    Types: Cigars, Cigarettes   Smokeless tobacco: Not on file   Tobacco comments:    trying to quit  Substance and  Sexual Activity   Alcohol use: Yes    Alcohol/week: 14.0 standard drinks of alcohol    Types: 14 Cans of beer per week    Comment:  1-2 beers a day or every other day   Drug use: No   Sexual activity: Not on file  Other Topics Concern   Not on file  Social History Narrative   Not on file   Social Determinants of Health   Financial Resource Strain: Not on file  Food Insecurity: Not on file  Transportation Needs: Not on file  Physical Activity: Not on file  Stress: Not on file  Social Connections: Not on file  Intimate Partner Violence: Not on file    Family History:   The patient's family history includes Cancer in his paternal grandfather; Coronary artery disease in his father; Diabetes in his maternal grandfather, maternal grandmother, paternal grandfather, and paternal grandmother. There is no history of Colon cancer.    ROS:  Please see the history of present illness.  All other ROS reviewed and negative.     Physical Exam/Data:   Vitals:   02/23/22 0913 02/23/22 0918 02/23/22 0923 02/23/22 0928  BP: 96/74 109/69 107/73 107/73  Pulse: 99 96 96 96  Resp: 14 15 20 19   SpO2: 97% 96% 97% (!) 89%   No intake or output data in the 24 hours ending 02/23/22 0937    06/28/2015    4:04 PM 05/30/2015   10:45 AM 10/15/2014    4:27 PM  Last 3 Weights  Weight (lbs) 163 lb 160 lb 163 lb 6.4 oz  Weight (kg) 73.936 kg 72.576 kg 74.118 kg     There is no height or weight on file to calculate BMI.  General:  Well nourished, well developed, in no acute distress HEENT: normal Neck: no JVD Vascular: No carotid bruits; Distal pulses 2+ bilaterally   Cardiac:  normal S1, S2; RRR; no murmur  Lungs:  clear to auscultation bilaterally, no wheezing, rhonchi or rales  Abd: soft, nontender, no hepatomegaly  Ext: no edema. Decreased pedal pulses L>R Musculoskeletal:  No deformities, BUE and BLE strength normal and equal Skin: warm and dry  Neuro:  CNs 2-12 intact, no focal abnormalities  noted Psych:  Normal affect    EKG:  ECGs from [facility] reviewed, show significant ST segment elevation in leads II, III, AVF. Reciprocal changes also appreciated.  Relevant CV Studies:   03/25/2020 TTE  Summary 1. Overall left ventricular ejection fraction is estimated at 60 to 65%. 2. Normal global left ventricular systolic function. 3. Normal left ventricular diastolic filling. 4. Mild mitral annular calcification.  Left Ventricle: Overall left ventricular ejection fraction is estimated at 60 to 65%. The left ventricular internal cavity size was normal. LV septal wall thickness was normal. LV posterior wall thickness is normal. Global LV systolic function was normal. Spectral Doppler shows normal pattern of LV diastolic filling.  Tissue Doppler indicates normal left ventricular end diastolic pressure..  Right Ventricle: Normal right ventricular size, wall thickness, and systolic function.  Left Atrium: The left atrium is normal in size.  Right Atrium: The right atrium is normal in size. The inferior vena cava measures 1.20 cm.  Pericardium: There is no evidence of pericardial effusion.  Mitral Valve: The mitral valve is normal in structure. Mild mitral annular calcification. No evidence of mitral valve regurgitation is seen.  Tricuspid Valve: The tricuspid valve is not well seen.  Aortic Valve: The aortic valve appears normal. No degree of aortic stenosis is present. There is mild aortic valve sclerosis. The peak pressure gradient across the aortic valve is 5.4 mmHg, and the mean pressure gradient is 3.0 mmHg. The calculated AVA is 2.87 cm. No evidence of aortic valve regurgitation is seen.  Pulmonic Valve: The pulmonic valve was not well visualized.  Aorta: The aortic root and ascending aorta appear structurally normal, with no evidence of dilatation.  Pulmonary Artery: The pulmonary artery is not well seen.  Venous: Visualized portions of the inferior  vena cava appear normal. The visualized portion of the inferior vena cava size is < 2.1cm in diameter, with respiratory variation greater than 50%.  Shunts: No evidence of intracardiac shunt.  02/23/22 LHC    Mid Cx-1 lesion is 100% stenosed.   2nd Mrg lesion is 100% stenosed.   Mid Cx-2 lesion is 70% stenosed.   A stent was successfully placed.   A stent was successfully placed.   Post intervention, there is a 0% residual stenosis.   Post intervention, there is a 0% residual stenosis.   Diagnostic Dominance: Right  Intervention    Laboratory Data:  High Sensitivity Troponin:  No results for input(s): "TROPONINIHS" in the last 720 hours.    ChemistryNo results for input(s): "NA", "K", "CL", "CO2", "GLUCOSE", "BUN", "CREATININE", "CALCIUM", "MG", "GFRNONAA", "GFRAA", "ANIONGAP" in the last 168 hours.  No results for input(s): "PROT", "ALBUMIN", "AST", "ALT", "ALKPHOS", "BILITOT" in the last 168 hours. Lipids No results for input(s): "CHOL", "TRIG", "HDL", "LABVLDL", "LDLCALC", "CHOLHDL" in the last 168 hours. HematologyNo results for input(s): "WBC", "RBC", "HGB", "HCT", "MCV", "MCH", "MCHC", "RDW", "PLT" in the last 168 hours. Thyroid No results for input(s): "TSH", "FREET4" in the last 168 hours. BNPNo results for input(s): "BNP", "PROBNP" in the last 168 hours.  DDimer No results for input(s): "DDIMER" in the last 168 hours.   Radiology/Studies:  No results found.   Assessment and Plan:  Gene Wright is a 61 y.o. male with history of diabetes mellitus type 2, chart evidence of poor control with hemoglobin A1c 9.2 in May 2022, former smoker, alcohol abuse, insomnia, horseshoe kidney, memory loss, COPD, primary hypertension, hyperlipidemia, and stage IIIa CKD. Patient had chest pain that started 12 hours ago and is being seen 02/23/2022 for the evaluation of inferior ST elevation MI, transported from [facility].   Inferior STEMI  Patient presented to [facility] ED with symptoms of chest pain this morning. ECG notable for ST elevation in leads II, III, AVF. Patient cardiac history notable January 2022 TTE for Bruce protocol exercise treadmill test 04/26/2020. He exercised for 3 minutes to a heart rate of 121 and extreme blood pressure elevation 210/99 mmHg.  No ischemic change.  Left leg pain with exercise. Test was stopped due to severe hypertensive blood pressure response.   LHC today with Dr. Tamala Julian, Mid Cx-1 lesion is 100% stenosed, 2nd Mrg lesion is 100% stenosed, Mid  Cx-2 lesion is 70% stenosed. DES successfully placed, 0% residual stenosis.  DAPT per Dr. Katrinka Blazing Obtain TTE this admission Plan to optimize GMDT: Start beta blocker Continue home lisinopril 20mg , titrate as able On home simvastatin, will replace with high intensity therapy  Hypertension  Patient with history of primary hypertension. Managed with Lisinopril 20mg  in outpatient setting. Plan to initiate beta blocker this admission.  Diabetes  A1c 6.8 as of 10/27/2020. On Jardiance only per [facility] PCP notes. Plan to continue.   Claudication  Patient with decreased pedal pulses on physical exam L>R. Can be further evaluated in outpatient setting.  Risk Assessment/Risk Scores:    TIMI Risk Score for ST  Elevation MI:   The patient's TIMI risk score is  , which indicates a  % risk of all cause mortality at 30 days.        Severity of Illness: The appropriate patient status for this patient is OBSERVATION. Observation status is judged to be reasonable and necessary in order to provide the required intensity of service to ensure the patient's safety. The patient's presenting symptoms, physical exam findings, and initial radiographic and laboratory data in the context of their medical condition is felt to place them at decreased risk for further clinical deterioration. Furthermore, it is anticipated that the patient will be medically stable for discharge from the  hospital within 2 midnights of admission.    For questions or updates, please contact [facility] Please consult [email or web address] for contact info under     Signed, 10/29/2020, PA-C  02/23/2022 9:37 AM

## 2022-02-23 NOTE — Progress Notes (Signed)
EKG CRITICAL VALUE     12 lead EKG performed.  Critical value noted.  Susanne Borders, RN notified.   Edmonia Caprio, CCT 02/23/2022 10:33 AM

## 2022-02-24 DIAGNOSIS — I10 Essential (primary) hypertension: Secondary | ICD-10-CM | POA: Diagnosis not present

## 2022-02-24 DIAGNOSIS — I2119 ST elevation (STEMI) myocardial infarction involving other coronary artery of inferior wall: Secondary | ICD-10-CM | POA: Diagnosis not present

## 2022-02-24 DIAGNOSIS — I739 Peripheral vascular disease, unspecified: Secondary | ICD-10-CM | POA: Diagnosis not present

## 2022-02-24 LAB — CBC
HCT: 39.1 % (ref 39.0–52.0)
Hemoglobin: 13.5 g/dL (ref 13.0–17.0)
MCH: 32.4 pg (ref 26.0–34.0)
MCHC: 34.5 g/dL (ref 30.0–36.0)
MCV: 93.8 fL (ref 80.0–100.0)
Platelets: 233 10*3/uL (ref 150–400)
RBC: 4.17 MIL/uL — ABNORMAL LOW (ref 4.22–5.81)
RDW: 13.2 % (ref 11.5–15.5)
WBC: 9.8 10*3/uL (ref 4.0–10.5)
nRBC: 0 % (ref 0.0–0.2)

## 2022-02-24 LAB — TROPONIN I (HIGH SENSITIVITY): Troponin I (High Sensitivity): >24000 ng/L (ref <18)

## 2022-02-24 LAB — BASIC METABOLIC PANEL
Anion gap: 6 (ref 5–15)
BUN: 11 mg/dL (ref 6–20)
CO2: 22 mmol/L (ref 22–32)
Calcium: 8.6 mg/dL — ABNORMAL LOW (ref 8.9–10.3)
Chloride: 110 mmol/L (ref 98–111)
Creatinine, Ser: 0.91 mg/dL (ref 0.61–1.24)
GFR, Estimated: >60 mL/min (ref >60)
Glucose, Bld: 124 mg/dL — ABNORMAL HIGH (ref 70–99)
Potassium: 3.7 mmol/L (ref 3.5–5.1)
Sodium: 138 mmol/L (ref 135–145)

## 2022-02-24 LAB — LIPID PANEL
Cholesterol: 161 mg/dL (ref 0–200)
HDL: 31 mg/dL — ABNORMAL LOW (ref >40)
LDL Cholesterol: 86 mg/dL (ref 0–99)
Total CHOL/HDL Ratio: 5.2 RATIO
Triglycerides: 218 mg/dL — ABNORMAL HIGH (ref <150)
VLDL: 44 mg/dL — ABNORMAL HIGH (ref 0–40)

## 2022-02-24 MED ORDER — POTASSIUM CHLORIDE CRYS ER 20 MEQ PO TBCR
40.0000 meq | EXTENDED_RELEASE_TABLET | Freq: Once | ORAL | Status: AC
  Administered 2022-02-24: 40 meq via ORAL
  Filled 2022-02-24: qty 2

## 2022-02-24 MED ORDER — LISINOPRIL 5 MG PO TABS
5.0000 mg | ORAL_TABLET | Freq: Every day | ORAL | Status: DC
  Administered 2022-02-24 – 2022-02-25 (×2): 5 mg via ORAL
  Filled 2022-02-24 (×2): qty 1

## 2022-02-24 MED ORDER — EMPAGLIFLOZIN 25 MG PO TABS
25.0000 mg | ORAL_TABLET | Freq: Every day | ORAL | Status: DC
  Administered 2022-02-24 – 2022-02-25 (×2): 25 mg via ORAL
  Filled 2022-02-24 (×2): qty 1

## 2022-02-24 NOTE — Progress Notes (Signed)
CARDIAC REHAB PHASE I   PRE:  Rate/Rhythm: NSR 76  BP:  Sitting:130/77      SaO2: 97%  MODE:  Ambulation: 200 ft   POST:  Rate/Rhythm: NSR 82  BP:  Sitting: 129/76      SaO2: 99  Pt received in bed and is agreeable to ambulate, Pt ambulated in hallway with steady gait. Pt denied chest pain, SOB, or dizziness. Pt placed on recliner chair with brake on and call bell in reach. Pt also agrees to post MI/PCI education. Pt was provided a MI booklet. Reviewed MI and PCI. Stent card in room and encouraged to copy and carry. Stressed medication compliance, especially Brilinta/ASA. Reviewed post cath care: S/S to report to MD, activity restrictions and wound care. Provided smoking cessation education as patient states he smokes 5 cigars per day. Pt voices a desire to quit. Provided handouts on heart healthy diet, Diabetes, and low Na diet. Reviewed exercise guidelines and reviewed RPE, weather parameters, S/S to terminate activity and use of NTG. Referral sent to [facility] for CRP2 program. Pt verbalized understanding of the education provided and all questions answered.   Lorin Picket MS, ACSM-CEP, CCRP  10:50 - 12:07

## 2022-02-24 NOTE — Progress Notes (Signed)
Progress Note  Patient Name: Gene Wright Date of Encounter: 02/24/2022  Primary Cardiologist: None  Subjective   No acute events overnight.  No chest pain after the procedure.  Radial access site intact.  No weakness/numbness/swelling.  Inpatient Medications    Scheduled Meds:  aspirin  81 mg Oral Daily   atorvastatin  80 mg Oral Daily   Chlorhexidine Gluconate Cloth  6 each Topical Daily   empagliflozin  25 mg Oral Daily   heparin  5,000 Units Subcutaneous Q8H   lisinopril  5 mg Oral Daily   metoprolol tartrate  25 mg Oral BID   sodium chloride flush  3 mL Intravenous Q12H   ticagrelor  90 mg Oral BID   Continuous Infusions:  sodium chloride     PRN Meds: sodium chloride, acetaminophen, ondansetron (ZOFRAN) IV, mouth rinse, mouth rinse, oxyCODONE, sodium chloride flush   Vital Signs    Vitals:   02/24/22 0300 02/24/22 0400 02/24/22 0500 02/24/22 0600  BP: 115/83 123/66 109/85 129/75  Pulse: 63 (!) 59 62 61  Resp: 13 16 18 20   Temp:  97.8 F (36.6 C)    TempSrc:  Oral    SpO2: 98% 100% 99% 98%    Intake/Output Summary (Last 24 hours) at 02/24/2022 0832 Last data filed at 02/24/2022 0600 Gross per 24 hour  Intake 645.38 ml  Output 1050 ml  Net -404.62 ml   There were no vitals filed for this visit.  Telemetry     Personally reviewed, normal sinus rhythm and HR controlled  ECG    EKG from 02/24/2022 showed resolving ST elevations in the inferior leads  Physical Exam   GEN: No acute distress.   Neck: No JVD. Cardiac: RRR, no murmur, rub, or gallop.  Respiratory: Nonlabored. Clear to auscultation bilaterally. GI: Soft, nontender, bowel sounds present. MS: No edema; No deformity. Neuro:  Nonfocal. Psych: Alert and oriented x 3. Normal affect.  Labs    Chemistry Recent Labs  Lab 02/23/22 1047 02/23/22 2120 02/24/22 0143  NA  --  137 138  K  --  3.8 3.7  CL  --  111 110  CO2  --  20* 22  GLUCOSE  --  129* 124*  BUN  --  10 11   CREATININE 0.88 1.02 0.91  CALCIUM  --  8.4* 8.6*  GFRNONAA >60 >60 >60  ANIONGAP  --  6 6     Hematology Recent Labs  Lab 02/23/22 1047 02/24/22 0143  WBC 9.8 9.8  RBC 4.75 4.17*  HGB 15.6 13.5  HCT 45.3 39.1  MCV 95.4 93.8  MCH 32.8 32.4  MCHC 34.4 34.5  RDW 12.9 13.2  PLT 249 233    Cardiac Enzymes Recent Labs  Lab 02/24/22 0615  TROPONINIHS >24,000*    BNPNo results for input(s): "BNP", "PROBNP" in the last 168 hours.   DDimerNo results for input(s): "DDIMER" in the last 168 hours.   Radiology    ECHOCARDIOGRAM COMPLETE  Result Date: 02/23/2022    ECHOCARDIOGRAM REPORT   Patient Name:   Gene Wright Date of Exam: 02/23/2022 Medical Rec #:  02/25/2022            Height:       63.0 in Accession #:    [id number]           Weight:       163.0 lb Date of Birth:  10/15/1960  BSA:          1.773 m Patient Age:    61 years             BP:           130/77 mmHg Patient Gender: M                    HR:           64 bpm. Exam Location:  Inpatient Procedure: 2D Echo Indications:     abnormal ecg  History:         Patient has no prior history of Echocardiogram examinations.                  COPD; Risk Factors:Hypertension, Dyslipidemia, Diabetes and                  Current Smoker.  Sonographer:     Delcie RochLauren Pennington RDCS Referring Phys:  [phone] Barry DienesHENRY W SMITH Diagnosing Phys: Epifanio Lescheshristopher Schumann MD  Sonographer Comments: Image acquisition challenging due to respiratory motion. IMPRESSIONS  1. Left ventricular ejection fraction, by estimation, is 45 to 50%. The left ventricle has mildly decreased function. The left ventricle demonstrates regional wall motion abnormalities (see scoring diagram/findings for description). There is mild left ventricular hypertrophy. Left ventricular diastolic parameters are indeterminate.  2. Right ventricular systolic function is normal. The right ventricular size is normal. There is normal pulmonary artery systolic pressure. The estimated  right ventricular systolic pressure is 23.2 mmHg.  3. The mitral valve is normal in structure. No evidence of mitral valve regurgitation. No evidence of mitral stenosis.  4. The aortic valve is tricuspid. Aortic valve regurgitation is not visualized. No aortic stenosis is present.  5. The inferior vena cava is dilated in size with >50% respiratory variability, suggesting right atrial pressure of 8 mmHg. FINDINGS  Left Ventricle: Left ventricular ejection fraction, by estimation, is 45 to 50%. The left ventricle has mildly decreased function. The left ventricle demonstrates regional wall motion abnormalities. The left ventricular internal cavity size was normal in size. There is mild left ventricular hypertrophy. Left ventricular diastolic parameters are indeterminate.  LV Wall Scoring: The antero-lateral wall and posterior wall are hypokinetic. The entire anterior wall, entire septum, entire apex, and entire inferior wall are normal. Right Ventricle: The right ventricular size is normal. No increase in right ventricular wall thickness. Right ventricular systolic function is normal. There is normal pulmonary artery systolic pressure. The tricuspid regurgitant velocity is 1.95 m/s, and  with an assumed right atrial pressure of 8 mmHg, the estimated right ventricular systolic pressure is 23.2 mmHg. Left Atrium: Left atrial size was normal in size. Right Atrium: Right atrial size was normal in size. Pericardium: There is no evidence of pericardial effusion. Mitral Valve: The mitral valve is normal in structure. No evidence of mitral valve regurgitation. No evidence of mitral valve stenosis. Tricuspid Valve: The tricuspid valve is normal in structure. Tricuspid valve regurgitation is trivial. Aortic Valve: The aortic valve is tricuspid. Aortic valve regurgitation is not visualized. No aortic stenosis is present. Pulmonic Valve: The pulmonic valve was grossly normal. Pulmonic valve regurgitation is trivial. Aorta: The  aortic root and ascending aorta are structurally normal, with no evidence of dilitation. Venous: The inferior vena cava is dilated in size with greater than 50% respiratory variability, suggesting right atrial pressure of 8 mmHg. IAS/Shunts: The interatrial septum was not well visualized.  LEFT VENTRICLE PLAX 2D LVIDd:  4.70 cm   Diastology LVIDs:         3.50 cm   LV e' medial:    7.07 cm/s LV PW:         1.10 cm   LV E/e' medial:  9.1 LV IVS:        1.00 cm   LV e' lateral:   5.11 cm/s LVOT diam:     1.90 cm   LV E/e' lateral: 12.6 LV SV:         46 LV SV Index:   26 LVOT Area:     2.84 cm  RIGHT VENTRICLE             IVC RV Basal diam:  2.80 cm     IVC diam: 2.20 cm RV S prime:     13.60 cm/s TAPSE (M-mode): 2.4 cm LEFT ATRIUM             Index        RIGHT ATRIUM           Index LA diam:        2.60 cm 1.47 cm/m   RA Area:     13.60 cm LA Vol (A2C):   52.9 ml 29.84 ml/m  RA Volume:   35.30 ml  19.91 ml/m LA Vol (A4C):   49.8 ml 28.09 ml/m LA Biplane Vol: 52.3 ml 29.50 ml/m  AORTIC VALVE             PULMONIC VALVE LVOT Vmax:   92.70 cm/s  PR End Diast Vel: 5.11 msec LVOT Vmean:  56.800 cm/s LVOT VTI:    0.163 m  AORTA Ao Root diam: 3.70 cm Ao Asc diam:  3.60 cm MITRAL VALVE               TRICUSPID VALVE MV Area (PHT): 2.99 cm    TR Peak grad:   15.2 mmHg MV Decel Time: 254 msec    TR Vmax:        195.00 cm/s MV E velocity: 64.30 cm/s MV A velocity: 56.80 cm/s  SHUNTS MV E/A ratio:  1.13        Systemic VTI:  0.16 m                            Systemic Diam: 1.90 cm Epifanio Lesches MD Electronically signed by Epifanio Lesches MD Signature Date/Time: 02/23/2022/3:27:11 PM    Final (Updated)    CARDIAC CATHETERIZATION  Result Date: 02/23/2022   3rd Mrg lesion is 60% stenosed.   Post intervention, there is a 0% residual stenosis. CONCLUSIONS: Occlusion of distal circumflex treated with PCI and overlapping stenting in the distal circumflex and into the third obtuse marginal, crossing over  the distal circumflex.  0% stenosis was noted post procedure with TIMI grade III flow.  100% stenosis with TIMI grade 0 flow was noted before PCI.  Stents were postdilated to 3.0 mm in diameter proximally. 50% distal left main Ramus intermedius with ostial to proximal 70% narrowing RCA with segmental 70% narrowing in the mid vessel Mild mid inferior wall hypokinesis.  LVEDP 20 mmHg.  EF 55%.  Hemodynamics are consistent with acute diastolic heart failure RECOMMENDATIONS: Aggressive risk factor modification, LDL less than 70, consider SGLT2 therapy,.  Dual antiplatelet therapy with aspirin and Brilinta, beta-blocker therapy, ARB therapy, and smoking cessation. Needs PAD workup.  Left leg greater than right claudication. Encouraged the patient to stop smoking.  Cardiac Studies   Echo from 02/23/2022 LVEF 45 to 50%.  The anterolateral wall and posterior wall are hypokinetic. No valve abnormalities.  LHC on 02/23/2022 CONCLUSIONS: Occlusion of distal circumflex treated with PCI and overlapping stenting in the distal circumflex and into the third obtuse marginal, crossing over the distal circumflex.  0% stenosis was noted post procedure with TIMI grade III flow.  100% stenosis with TIMI grade 0 flow was noted before PCI.  Stents were postdilated to 3.0 mm in diameter proximally. 50% distal left main Ramus intermedius with ostial to proximal 70% narrowing RCA with segmental 70% narrowing in the mid vessel Mild mid inferior wall hypokinesis.  LVEDP 20 mmHg.  EF 55%.  Hemodynamics are consistent with acute diastolic heart failure   RECOMMENDATIONS: Aggressive risk factor modification, LDL less than 70, consider SGLT2 therapy,.  Dual antiplatelet therapy with aspirin and Brilinta, beta-blocker therapy, ARB therapy, and smoking cessation. Needs PAD workup.  Left leg greater than right claudication. Encouraged the patient to stop smoking.  Assessment & Plan   Patient is a 61 year old M known to have DM  2 (poorly controlled), nicotine abuse, horseshoe kidney, COPD, HTN, HLD, stage IIIa CKD was transferred from [facility] for inferior STEMI. He is s/p LCx PCI with residual moderate CAD in other vessels (50% LM, ramus 70% and RCA 70% stenosis) with LVEF 45 to 50%.  # CAD manifested by inferior STEMI on 02/23/2022 s/p LCx PCI with residual moderate CAD (50% LM, ramus 70% and RCA 70% stenosis) and LVEF 45 to 50% with RWMA, currently angina free -Continue aspirin 81 mg once daily and Brilinta 90 mg twice daily for 1 year, uninterrupted. -Continue atorvastatin 80 mg nightly (replaced home simvastatin) -Continue metoprolol tartrate 25 mg twice daily.  Will switch to metoprolol succinate 25 mg once daily upon discharge. -Start lisinopril 5 mg once daily (reduced from home dose of lisinopril 20 mg once daily due to soft BP) -Start Jardiance 25 mg once daily (home dose) -Cardiology outpatient follow-up -Cardiac rehabilitation referral  # Bilateral intermittent lower extremity claudication, rule out PAD -Outpatient ABI with USG arterial Doppler lower extremities  # HTN, controlled -Continue metoprolol tartrate 25 mg twice daily, start lisinopril 5 mg once daily (dose was decreased compared to home dose due to soft BP) and start Jardiance 25 mg once daily.  I have spent a total of 33 minutes with patient reviewing chart , telemetry, EKGs, labs and examining patient as well as establishing an assessment and plan that was discussed with the patient.  > 50% of time was spent in direct patient care.    Signed, Marjo Bicker, MD  02/24/2022, 8:32 AM

## 2022-02-25 ENCOUNTER — Encounter (HOSPITAL_COMMUNITY): Payer: Self-pay | Admitting: Interventional Cardiology

## 2022-02-25 DIAGNOSIS — I2510 Atherosclerotic heart disease of native coronary artery without angina pectoris: Secondary | ICD-10-CM

## 2022-02-25 DIAGNOSIS — I2119 ST elevation (STEMI) myocardial infarction involving other coronary artery of inferior wall: Secondary | ICD-10-CM | POA: Diagnosis not present

## 2022-02-25 DIAGNOSIS — Z720 Tobacco use: Secondary | ICD-10-CM | POA: Diagnosis not present

## 2022-02-25 DIAGNOSIS — E785 Hyperlipidemia, unspecified: Secondary | ICD-10-CM

## 2022-02-25 DIAGNOSIS — I10 Essential (primary) hypertension: Secondary | ICD-10-CM | POA: Diagnosis not present

## 2022-02-25 DIAGNOSIS — I5022 Chronic systolic (congestive) heart failure: Secondary | ICD-10-CM

## 2022-02-25 DIAGNOSIS — I739 Peripheral vascular disease, unspecified: Secondary | ICD-10-CM | POA: Diagnosis not present

## 2022-02-25 LAB — COMPREHENSIVE METABOLIC PANEL
ALT: 34 U/L (ref 0–44)
AST: 68 U/L — ABNORMAL HIGH (ref 15–41)
Albumin: 3.4 g/dL — ABNORMAL LOW (ref 3.5–5.0)
Alkaline Phosphatase: 96 U/L (ref 38–126)
Anion gap: 7 (ref 5–15)
BUN: 15 mg/dL (ref 6–20)
CO2: 21 mmol/L — ABNORMAL LOW (ref 22–32)
Calcium: 8.8 mg/dL — ABNORMAL LOW (ref 8.9–10.3)
Chloride: 110 mmol/L (ref 98–111)
Creatinine, Ser: 0.95 mg/dL (ref 0.61–1.24)
GFR, Estimated: >60 mL/min (ref >60)
Glucose, Bld: 104 mg/dL — ABNORMAL HIGH (ref 70–99)
Potassium: 4.5 mmol/L (ref 3.5–5.1)
Sodium: 138 mmol/L (ref 135–145)
Total Bilirubin: 0.9 mg/dL (ref 0.3–1.2)
Total Protein: 6.4 g/dL — ABNORMAL LOW (ref 6.5–8.1)

## 2022-02-25 LAB — CBC
HCT: 44.1 % (ref 39.0–52.0)
Hemoglobin: 15.1 g/dL (ref 13.0–17.0)
MCH: 32.6 pg (ref 26.0–34.0)
MCHC: 34.2 g/dL (ref 30.0–36.0)
MCV: 95.2 fL (ref 80.0–100.0)
Platelets: 258 10*3/uL (ref 150–400)
RBC: 4.63 MIL/uL (ref 4.22–5.81)
RDW: 13 % (ref 11.5–15.5)
WBC: 8.1 10*3/uL (ref 4.0–10.5)
nRBC: 0 % (ref 0.0–0.2)

## 2022-02-25 LAB — GLUCOSE, CAPILLARY: Glucose-Capillary: 92 mg/dL (ref 70–99)

## 2022-02-25 MED ORDER — LISINOPRIL 5 MG PO TABS: 5.0000 mg | ORAL_TABLET | Freq: Every day | ORAL | 3 refills | Status: AC

## 2022-02-25 MED ORDER — TICAGRELOR 90 MG PO TABS: 90.0000 mg | ORAL_TABLET | Freq: Two times a day (BID) | ORAL | 0 refills | Status: DC

## 2022-02-25 MED ORDER — ATORVASTATIN CALCIUM 80 MG PO TABS: 80.0000 mg | ORAL_TABLET | Freq: Every day | ORAL | 3 refills | Status: AC

## 2022-02-25 MED ORDER — METOPROLOL SUCCINATE ER 25 MG PO TB24: 25.0000 mg | ORAL_TABLET | Freq: Every day | ORAL | 3 refills | Status: AC

## 2022-02-25 MED ORDER — ACETAMINOPHEN 325 MG PO TABS: 650.0000 mg | ORAL_TABLET | Freq: Four times a day (QID) | ORAL | Status: AC | PRN

## 2022-02-25 NOTE — Hospital Course (Addendum)
Discharge Summary    Patient ID: Gene Wright MRN: [id number]; DOB: Mar 25, 1960  Admit date: 02/23/2022 Discharge date: 02/25/2022  PCP:  Timmothy Euler, MD (Inactive)   [place] Providers Cardiologist:  Chalmers Guest, MD   { Click here to update MD or APP on Care Team, Refresh:1}     Discharge Diagnoses    Principal Problem:   Acute ST elevation myocardial infarction (STEMI) of inferior wall ([place]) Active Problems:   HTN (hypertension)   Diabetes ([place])   Claudication in peripheral vascular disease ([place])   COPD (chronic obstructive pulmonary disease) ([place])   Hyperlipidemia LDL goal <70   CAD (coronary artery disease)   Heart failure with mildly reduced ejection fraction (HFmrEF) ([place])   Diagnostic Studies/Procedures    LEFT HEART CATH  02/23/2022 CONCLUSIONS: Occlusion of distal circumflex treated with PCI and overlapping stenting in the distal circumflex and into the third obtuse marginal, crossing over the distal circumflex.  0% stenosis was noted post procedure with TIMI grade III flow.  100% stenosis with TIMI grade 0 flow was noted before PCI.  Stents were postdilated to 3.0 mm in diameter proximally. 50% distal left main Ramus intermedius with ostial to proximal 70% narrowing RCA with segmental 70% narrowing in the mid vessel Mild mid inferior wall hypokinesis.  LVEDP 20 mmHg.  EF 55%.  Hemodynamics are consistent with acute diastolic heart failure  RECOMMENDATIONS: Aggressive risk factor modification, LDL less than 70, consider SGLT2 therapy,.  Dual antiplatelet therapy with aspirin and Brilinta, beta-blocker therapy, ARB therapy, and smoking cessation. Needs PAD workup.  Left leg greater than right claudication. Encouraged the patient to stop smoking.         ECHO COMPLETE WO IMAGING ENHANCING AGENT 02/23/2022 IMPRESSIONS 1. Left ventricular ejection fraction, by estimation, is 45 to 50%. The left ventricle has mildly  decreased function. The left ventricle demonstrates regional wall motion abnormalities (see scoring diagram/findings for description). There is mild left ventricular hypertrophy. Left ventricular diastolic parameters are indeterminate. 2. Right ventricular systolic function is normal. The right ventricular size is normal. There is normal pulmonary artery systolic pressure. The estimated right ventricular systolic pressure is 35.3 mmHg. 3. The mitral valve is normal in structure. No evidence of mitral valve regurgitation. No evidence of mitral stenosis. 4. The aortic valve is tricuspid. Aortic valve regurgitation is not visualized. No aortic stenosis is present. 5. The inferior vena cava is dilated in size with >50% respiratory variability, suggesting right atrial pressure of 8 mmHg.  LV Wall Scoring: The antero-lateral wall and posterior wall are hypokinetic. The entire anterior wall, entire septum, entire apex, and entire inferior wall are normal. _____________   History of Present Illness     Gene Wright is a 61 y.o. male with a history of diabetes mellitus, hypertension, hyperlipidemia, chronic kidney disease stage IIIa, former smoker, ETOH abuse, Chronic Obstructive Pulmonary Disease, horseshoe kidney, memory loss who presented to [facility] with chest pain on 02/23/22. EKG demonstrated Inf ST elevation. He was transferred to [facility] emergently for further management of Inferior STEMI.   Hospital Course     Consultants:   none   The patient was taken emergently to the cardiac catheterization lab by Dr. Tamala Julian upon arrival. Angiography demonstrated an occluded distal LCx. He underwent PCI with 2 overlapping DES in the dLCx and into the OM3. He tolerated the procedure well and had no immediate complications. Residual disease included dLM 50, ost and prox RI 70, mRCA 70.  There was inf wall HK. LVEDP was 20 and EF was 55. Hemodynamics were c/w acute (HFpEF) heart failure with  preserved ejection fraction. An echocardiogram demonstrated mildly reduced LVF with EF 45-50 w ant-lat and post HK. He was started on ASA, Brilinta, high dose statin (Lipitor 80), beta-blocker (metoprolol tartrate 25 twice daily). His BPs were soft. His dose of Lisinopril was reduced to 5 mg once daily and his beta-blocker was changed to Toprol XL 25 mg once daily. If his BP increases, his home dose of Lisinopril can be resumed at his f/u office visit.   Of note, the patient had symptoms of intermittent claudication. He will need outpatient workup initiated at his post hospital follow up visit.   His co-pay for Brilinta will be $400. He was given a 30 day free card for Brilinta. He will need to be transitioned to Plavix after 30 days at his f/u office visit (instructions left in notes).   He was seen by Dr. Dellia Cloud this morning who felt the patient is in stable condition and ready for DC to home.      Did the patient have an acute coronary syndrome (MI, NSTEMI, STEMI, etc) this admission?:  Yes                               AHA/ACC Clinical Performance & Quality Measures: Aspirin prescribed? - Yes ADP Receptor Inhibitor (Plavix/Clopidogrel, Brilinta/Ticagrelor or Effient/Prasugrel) prescribed (includes medically managed patients)? - Yes Beta Blocker prescribed? - Yes High Intensity Statin (Lipitor 40-62m or Crestor 20-419m prescribed? - Yes EF assessed during THIS hospitalization? - Yes For EF <40%, was ACEI/ARB prescribed? - Yes For EF <40%, Aldosterone Antagonist (Spironolactone or Eplerenone) prescribed? - Not Applicable (EF >/= 4041%Cardiac Rehab Phase II ordered (including medically managed patients)? - Yes      The patient will be scheduled for a TOC follow up appointment in 7 days (12/27 at 10 am with ElFinis BudNP at 10 am).  [facility] the office where the patient should be seen for follow up.  _____________  Discharge  Vitals Blood pressure 117/83, pulse 97, temperature 97.6 F (36.4 C), temperature source Oral, resp. rate 17, SpO2 95 %.  There were no vitals filed for this visit.  Labs & Radiologic Studies    CBC Recent Labs    02/24/22 0143 02/25/22 0934  WBC 9.8 8.1  HGB 13.5 15.1  HCT 39.1 44.1  MCV 93.8 95.2  PLT 233 [id number] Basic Metabolic Panel Recent Labs    02/23/22 2120 02/24/22 0143  NA 137 138  K 3.8 3.7  CL 111 110  CO2 20* 22  GLUCOSE 129* 124*  BUN 10 11  CREATININE 1.02 0.91  CALCIUM 8.4* 8.6*  MG 2.1  --     High Sensitivity Troponin:   Recent Labs  Lab 02/24/22 0615  TROPONINIHS >24,000*    BNP Invalid input(s): "POCBNP" D-Dimer No results for input(s): "DDIMER" in the last 72 hours. Hemoglobin A1C No results for input(s): "HGBA1C" in the last 72 hours. Fasting Lipid Panel Recent Labs    02/24/22 0143  CHOL 161  HDL 31*  LDLCALC 86  TRIG 218*  CHOLHDL 5.2   Thyroid Function Tests No results for input(s): "TSH", "T4TOTAL", "T3FREE", "THYROIDAB" in the last 72 hours.  Invalid input(s): "FREET3" _____________  ECHOCARDIOGRAM COMPLETE  Result Date: 02/23/2022  ECHOCARDIOGRAM REPORT   Patient Name:   KAZUO DURNIL Date of Exam: 02/23/2022 Medical Rec #:  [id number]            Height:       63.0 in Accession #:    [id number]           Weight:       163.0 lb Date of Birth:  Jul 17, 1960            BSA:          1.773 m Patient Age:    39 years             BP:           130/77 mmHg Patient Gender: M                    HR:           64 bpm. Exam Location:  Inpatient Procedure: 2D Echo Indications:     abnormal ecg  History:         Patient has no prior history of Echocardiogram examinations.                  COPD; Risk Factors:Hypertension, Dyslipidemia, Diabetes and                  Current Smoker.  Sonographer:     Johny Chess RDCS Referring Phys:  [place] Diagnosing Phys: Oswaldo Milian MD  Sonographer Comments: Image acquisition  challenging due to respiratory motion. IMPRESSIONS  1. Left ventricular ejection fraction, by estimation, is 45 to 50%. The left ventricle has mildly decreased function. The left ventricle demonstrates regional wall motion abnormalities (see scoring diagram/findings for description). There is mild left ventricular hypertrophy. Left ventricular diastolic parameters are indeterminate.  2. Right ventricular systolic function is normal. The right ventricular size is normal. There is normal pulmonary artery systolic pressure. The estimated right ventricular systolic pressure is 01.7 mmHg.  3. The mitral valve is normal in structure. No evidence of mitral valve regurgitation. No evidence of mitral stenosis.  4. The aortic valve is tricuspid. Aortic valve regurgitation is not visualized. No aortic stenosis is present.  5. The inferior vena cava is dilated in size with >50% respiratory variability, suggesting right atrial pressure of 8 mmHg. FINDINGS  Left Ventricle: Left ventricular ejection fraction, by estimation, is 45 to 50%. The left ventricle has mildly decreased function. The left ventricle demonstrates regional wall motion abnormalities. The left ventricular internal cavity size was normal in size. There is mild left ventricular hypertrophy. Left ventricular diastolic parameters are indeterminate.  LV Wall Scoring: The antero-lateral wall and posterior wall are hypokinetic. The entire anterior wall, entire septum, entire apex, and entire inferior wall are normal. Right Ventricle: The right ventricular size is normal. No increase in right ventricular wall thickness. Right ventricular systolic function is normal. There is normal pulmonary artery systolic pressure. The tricuspid regurgitant velocity is 1.95 m/s, and  with an assumed right atrial pressure of 8 mmHg, the estimated right ventricular systolic pressure is 49.4 mmHg. Left Atrium: Left atrial size was normal in size. Right Atrium: Right atrial size was  normal in size. Pericardium: There is no evidence of pericardial effusion. Mitral Valve: The mitral valve is normal in structure. No evidence of mitral valve regurgitation. No evidence of mitral valve stenosis. Tricuspid Valve: The tricuspid valve is normal in structure. Tricuspid valve regurgitation is trivial. Aortic Valve: The aortic valve is  tricuspid. Aortic valve regurgitation is not visualized. No aortic stenosis is present. Pulmonic Valve: The pulmonic valve was grossly normal. Pulmonic valve regurgitation is trivial. Aorta: The aortic root and ascending aorta are structurally normal, with no evidence of dilitation. Venous: The inferior vena cava is dilated in size with greater than 50% respiratory variability, suggesting right atrial pressure of 8 mmHg. IAS/Shunts: The interatrial septum was not well visualized.  LEFT VENTRICLE PLAX 2D LVIDd:         4.70 cm   Diastology LVIDs:         3.50 cm   LV e' medial:    7.07 cm/s LV PW:         1.10 cm   LV E/e' medial:  9.1 LV IVS:        1.00 cm   LV e' lateral:   5.11 cm/s LVOT diam:     1.90 cm   LV E/e' lateral: 12.6 LV SV:         46 LV SV Index:   26 LVOT Area:     2.84 cm  RIGHT VENTRICLE             IVC RV Basal diam:  2.80 cm     IVC diam: 2.20 cm RV S prime:     13.60 cm/s TAPSE (M-mode): 2.4 cm LEFT ATRIUM             Index        RIGHT ATRIUM           Index LA diam:        2.60 cm 1.47 cm/m   RA Area:     13.60 cm LA Vol (A2C):   52.9 ml 29.84 ml/m  RA Volume:   35.30 ml  19.91 ml/m LA Vol (A4C):   49.8 ml 28.09 ml/m LA Biplane Vol: 52.3 ml 29.50 ml/m  AORTIC VALVE             PULMONIC VALVE LVOT Vmax:   92.70 cm/s  PR End Diast Vel: 5.11 msec LVOT Vmean:  56.800 cm/s LVOT VTI:    0.163 m  AORTA Ao Root diam: 3.70 cm Ao Asc diam:  3.60 cm MITRAL VALVE               TRICUSPID VALVE MV Area (PHT): 2.99 cm    TR Peak grad:   15.2 mmHg MV Decel Time: 254 msec    TR Vmax:        195.00 cm/s MV E velocity: 64.30 cm/s MV A velocity: 56.80 cm/s   SHUNTS MV E/A ratio:  1.13        Systemic VTI:  0.16 m                            Systemic Diam: 1.90 cm Oswaldo Milian MD Electronically signed by Oswaldo Milian MD Signature Date/Time: 02/23/2022/3:27:11 PM    Final (Updated)    CARDIAC CATHETERIZATION  Result Date: 02/23/2022   3rd Mrg lesion is 60% stenosed.   Post intervention, there is a 0% residual stenosis. CONCLUSIONS: Occlusion of distal circumflex treated with PCI and overlapping stenting in the distal circumflex and into the third obtuse marginal, crossing over the distal circumflex.  0% stenosis was noted post procedure with TIMI grade III flow.  100% stenosis with TIMI grade 0 flow was noted before PCI.  Stents were postdilated to 3.0 mm in diameter proximally. 50% distal left  main Ramus intermedius with ostial to proximal 70% narrowing RCA with segmental 70% narrowing in the mid vessel Mild mid inferior wall hypokinesis.  LVEDP 20 mmHg.  EF 55%.  Hemodynamics are consistent with acute diastolic heart failure RECOMMENDATIONS: Aggressive risk factor modification, LDL less than 70, consider SGLT2 therapy,.  Dual antiplatelet therapy with aspirin and Brilinta, beta-blocker therapy, ARB therapy, and smoking cessation. Needs PAD workup.  Left leg greater than right claudication. Encouraged the patient to stop smoking.   Disposition   Pt is being discharged home today in good condition.  Follow-up Plans & Appointments     Discharge Instructions     Amb Referral to Cardiac Rehabilitation   Complete by: As directed    Diagnosis:  Coronary Stents STEMI     After initial evaluation and assessments completed: Virtual Based Care may be provided alone or in conjunction with Phase 2 Cardiac Rehab based on patient barriers.: Yes   Intensive Cardiac Rehabilitation (ICR) [place] location only OR Traditional Cardiac Rehabilitation (TCR) *If criteria for ICR are not met will enroll in TCR [facility] only): Yes   Diet - low sodium heart healthy    Complete by: As directed    Discharge wound care:   Complete by: As directed    Call [phone] for any swelling, bleeding bruising or fever.   Driving Restrictions   Complete by: As directed    Do not drive for 2 weeks   Increase activity slowly   Complete by: As directed    Lifting restrictions   Complete by: As directed    Do not lift over 5 lbs for 2 weeks   Sexual Activity Restrictions   Complete by: As directed    None for 2 weeks        Discharge Medications   Allergies as of 02/25/2022       Reactions   Biaxin [clarithromycin] Hives, Itching   Metformin    Makes me sick   Hydroxyzine Itching        Medication List     STOP taking these medications    rosuvastatin 20 MG tablet Commonly known as: CRESTOR       TAKE these medications    acetaminophen 325 MG tablet Commonly known as: TYLENOL Take 2 tablets (650 mg total) by mouth every 6 (six) hours as needed for headache or mild pain.   aspirin 81 MG tablet Take 81 mg by mouth daily.   atorvastatin 80 MG tablet Commonly known as: LIPITOR Take 1 tablet (80 mg total) by mouth daily.   cetirizine 10 MG tablet Commonly known as: ZYRTEC Take 1 tablet (10 mg total) by mouth daily.   Jardiance 25 MG Tabs tablet Generic drug: empagliflozin Take 25 mg by mouth daily.   lisinopril 5 MG tablet Commonly known as: ZESTRIL Take 1 tablet (5 mg total) by mouth daily. What changed:  medication strength how much to take   metoprolol succinate 25 MG 24 hr tablet Commonly known as: TOPROL-XL Take 1 tablet (25 mg total) by mouth daily.   montelukast 10 MG tablet Commonly known as: SINGULAIR Take 1 tablet (10 mg total) by mouth at bedtime.   ticagrelor 90 MG Tabs tablet Commonly known as: BRILINTA Take 1 tablet (90 mg total) by mouth 2 (two) times daily. You will change to Plavix after 30 days               Discharge Care Instructions  (From admission, onward)  Start      Ordered   02/25/22 0000  Discharge wound care:       Comments: Call [phone] for any swelling, bleeding bruising or fever.   02/25/22 1033               Outstanding Labs/Studies   Lipids and LFTs in 8 weeks   Duration of Discharge Encounter   Greater than 65 minutes including physician time.  Signed, Richardson Dopp, PA-C 02/25/2022, 10:33 AM

## 2022-02-25 NOTE — Progress Notes (Signed)
Progress Note  Patient Name: Gene Wright Date of Encounter: 02/25/2022  Primary Cardiologist: None  Subjective   No acute events overnight.  No symptoms.  No chest pain after the procedure.  Inpatient Medications    Scheduled Meds:  aspirin  81 mg Oral Daily   atorvastatin  80 mg Oral Daily   Chlorhexidine Gluconate Cloth  6 each Topical Daily   empagliflozin  25 mg Oral Daily   heparin  5,000 Units Subcutaneous Q8H   lisinopril  5 mg Oral Daily   metoprolol tartrate  25 mg Oral BID   sodium chloride flush  3 mL Intravenous Q12H   ticagrelor  90 mg Oral BID   Continuous Infusions:  sodium chloride     PRN Meds: sodium chloride, acetaminophen, ondansetron (ZOFRAN) IV, mouth rinse, mouth rinse, oxyCODONE, sodium chloride flush   Vital Signs    Vitals:   02/25/22 0700 02/25/22 0715 02/25/22 0730 02/25/22 0745  BP: 117/83     Pulse: 95 88 90 97  Resp:      Temp:      TempSrc:      SpO2: 95% 93% 92% 95%    Intake/Output Summary (Last 24 hours) at 02/25/2022 0944 Last data filed at 02/24/2022 2100 Gross per 24 hour  Intake 360 ml  Output --  Net 360 ml   There were no vitals filed for this visit.  Telemetry     Personally reviewed, normal sinus rhythm and HR controlled  ECG    EKG from 02/24/2022 showed resolving ST elevations in the inferior leads  Physical Exam   GEN: No acute distress.   Neck: No JVD. Cardiac: RRR, no murmur, rub, or gallop.  Respiratory: Nonlabored. Clear to auscultation bilaterally. GI: Soft, nontender, bowel sounds present. MS: No edema; No deformity. Neuro:  Nonfocal. Psych: Alert and oriented x 3. Normal affect.  Labs    Chemistry Recent Labs  Lab 02/23/22 1047 02/23/22 2120 02/24/22 0143  NA  --  137 138  K  --  3.8 3.7  CL  --  111 110  CO2  --  20* 22  GLUCOSE  --  129* 124*  BUN  --  10 11  CREATININE 0.88 1.02 0.91  CALCIUM  --  8.4* 8.6*  GFRNONAA >60 >60 >60  ANIONGAP  --  6 6      Hematology Recent Labs  Lab 02/23/22 1047 02/24/22 0143 02/25/22 0934  WBC 9.8 9.8 8.1  RBC 4.75 4.17* 4.63  HGB 15.6 13.5 15.1  HCT 45.3 39.1 44.1  MCV 95.4 93.8 95.2  MCH 32.8 32.4 32.6  MCHC 34.4 34.5 34.2  RDW 12.9 13.2 13.0  PLT 249 233 258    Cardiac Enzymes Recent Labs  Lab 02/24/22 0615  TROPONINIHS >24,000*    BNPNo results for input(s): "BNP", "PROBNP" in the last 168 hours.   DDimerNo results for input(s): "DDIMER" in the last 168 hours.   Radiology    ECHOCARDIOGRAM COMPLETE  Result Date: 02/23/2022    ECHOCARDIOGRAM REPORT   Patient Name:   Gene Wright Date of Exam: 02/23/2022 Medical Rec #:  [id number]            Height:       63.0 in Accession #:    [id number]           Weight:       163.0 lb Date of Birth:  April 14, 1960  BSA:          1.773 m Patient Age:    61 years             BP:           130/77 mmHg Patient Gender: M                    HR:           64 bpm. Exam Location:  Inpatient Procedure: 2D Echo Indications:     abnormal ecg  History:         Patient has no prior history of Echocardiogram examinations.                  COPD; Risk Factors:Hypertension, Dyslipidemia, Diabetes and                  Current Smoker.  Sonographer:     Delcie RochLauren Pennington RDCS Referring Phys:  [phone] Barry DienesHENRY W SMITH Diagnosing Phys: Epifanio Lescheshristopher Schumann MD  Sonographer Comments: Image acquisition challenging due to respiratory motion. IMPRESSIONS  1. Left ventricular ejection fraction, by estimation, is 45 to 50%. The left ventricle has mildly decreased function. The left ventricle demonstrates regional wall motion abnormalities (see scoring diagram/findings for description). There is mild left ventricular hypertrophy. Left ventricular diastolic parameters are indeterminate.  2. Right ventricular systolic function is normal. The right ventricular size is normal. There is normal pulmonary artery systolic pressure. The estimated right ventricular systolic pressure is  23.2 mmHg.  3. The mitral valve is normal in structure. No evidence of mitral valve regurgitation. No evidence of mitral stenosis.  4. The aortic valve is tricuspid. Aortic valve regurgitation is not visualized. No aortic stenosis is present.  5. The inferior vena cava is dilated in size with >50% respiratory variability, suggesting right atrial pressure of 8 mmHg. FINDINGS  Left Ventricle: Left ventricular ejection fraction, by estimation, is 45 to 50%. The left ventricle has mildly decreased function. The left ventricle demonstrates regional wall motion abnormalities. The left ventricular internal cavity size was normal in size. There is mild left ventricular hypertrophy. Left ventricular diastolic parameters are indeterminate.  LV Wall Scoring: The antero-lateral wall and posterior wall are hypokinetic. The entire anterior wall, entire septum, entire apex, and entire inferior wall are normal. Right Ventricle: The right ventricular size is normal. No increase in right ventricular wall thickness. Right ventricular systolic function is normal. There is normal pulmonary artery systolic pressure. The tricuspid regurgitant velocity is 1.95 m/s, and  with an assumed right atrial pressure of 8 mmHg, the estimated right ventricular systolic pressure is 23.2 mmHg. Left Atrium: Left atrial size was normal in size. Right Atrium: Right atrial size was normal in size. Pericardium: There is no evidence of pericardial effusion. Mitral Valve: The mitral valve is normal in structure. No evidence of mitral valve regurgitation. No evidence of mitral valve stenosis. Tricuspid Valve: The tricuspid valve is normal in structure. Tricuspid valve regurgitation is trivial. Aortic Valve: The aortic valve is tricuspid. Aortic valve regurgitation is not visualized. No aortic stenosis is present. Pulmonic Valve: The pulmonic valve was grossly normal. Pulmonic valve regurgitation is trivial. Aorta: The aortic root and ascending aorta are  structurally normal, with no evidence of dilitation. Venous: The inferior vena cava is dilated in size with greater than 50% respiratory variability, suggesting right atrial pressure of 8 mmHg. IAS/Shunts: The interatrial septum was not well visualized.  LEFT VENTRICLE PLAX 2D LVIDd:  4.70 cm   Diastology LVIDs:         3.50 cm   LV e' medial:    7.07 cm/s LV PW:         1.10 cm   LV E/e' medial:  9.1 LV IVS:        1.00 cm   LV e' lateral:   5.11 cm/s LVOT diam:     1.90 cm   LV E/e' lateral: 12.6 LV SV:         46 LV SV Index:   26 LVOT Area:     2.84 cm  RIGHT VENTRICLE             IVC RV Basal diam:  2.80 cm     IVC diam: 2.20 cm RV S prime:     13.60 cm/s TAPSE (M-mode): 2.4 cm LEFT ATRIUM             Index        RIGHT ATRIUM           Index LA diam:        2.60 cm 1.47 cm/m   RA Area:     13.60 cm LA Vol (A2C):   52.9 ml 29.84 ml/m  RA Volume:   35.30 ml  19.91 ml/m LA Vol (A4C):   49.8 ml 28.09 ml/m LA Biplane Vol: 52.3 ml 29.50 ml/m  AORTIC VALVE             PULMONIC VALVE LVOT Vmax:   92.70 cm/s  PR End Diast Vel: 5.11 msec LVOT Vmean:  56.800 cm/s LVOT VTI:    0.163 m  AORTA Ao Root diam: 3.70 cm Ao Asc diam:  3.60 cm MITRAL VALVE               TRICUSPID VALVE MV Area (PHT): 2.99 cm    TR Peak grad:   15.2 mmHg MV Decel Time: 254 msec    TR Vmax:        195.00 cm/s MV E velocity: 64.30 cm/s MV A velocity: 56.80 cm/s  SHUNTS MV E/A ratio:  1.13        Systemic VTI:  0.16 m                            Systemic Diam: 1.90 cm Epifanio Lesches MD Electronically signed by Epifanio Lesches MD Signature Date/Time: 02/23/2022/3:27:11 PM    Final (Updated)     Cardiac Studies   Echo from 02/23/2022 LVEF 45 to 50%.  The anterolateral wall and posterior wall are hypokinetic. No valve abnormalities.  LHC on 02/23/2022 CONCLUSIONS: Occlusion of distal circumflex treated with PCI and overlapping stenting in the distal circumflex and into the third obtuse marginal, crossing over the distal  circumflex.  0% stenosis was noted post procedure with TIMI grade III flow.  100% stenosis with TIMI grade 0 flow was noted before PCI.  Stents were postdilated to 3.0 mm in diameter proximally. 50% distal left main Ramus intermedius with ostial to proximal 70% narrowing RCA with segmental 70% narrowing in the mid vessel Mild mid inferior wall hypokinesis.  LVEDP 20 mmHg.  EF 55%.  Hemodynamics are consistent with acute diastolic heart failure   RECOMMENDATIONS: Aggressive risk factor modification, LDL less than 70, consider SGLT2 therapy,.  Dual antiplatelet therapy with aspirin and Brilinta, beta-blocker therapy, ARB therapy, and smoking cessation. Needs PAD workup.  Left leg greater than right claudication. Encouraged the  patient to stop smoking.  Assessment & Plan   Patient is a 61 year old M known to have DM 2 (poorly controlled), nicotine abuse, horseshoe kidney, COPD, HTN, HLD, stage IIIa CKD was transferred from [facility] for inferior STEMI. He is s/p LCx PCI with residual moderate CAD in other vessels (50% LM, ramus 70% and RCA 70% stenosis) with LVEF 45 to 50%.  # CAD manifested by inferior STEMI on 02/23/2022 s/p LCx PCI with residual moderate CAD (50% LM, ramus 70% and RCA 70% stenosis) and LVEF 45 to 50% with RWMA, currently angina free -Continue aspirin 81 mg once daily and Brilinta 90 mg twice daily for 1 year, uninterrupted. Patient not able to afford Brilinta after 1 month due to $400 co-pay. Will switch to Plavix after 1 month. -Continue atorvastatin 80 mg nightly (replaced home simvastatin) -Switch metoprolol tartrate to succinate 25 mg once daily -Continue lisinopril 5 mg once daily -Continue Jardiance 25 mg once daily -Cardiology outpatient follow-up -Cardiac rehabilitation referral  # Bilateral intermittent lower extremity claudication, rule out PAD -Obtain outpatient ABI with ultrasound arterial Doppler lower extremities  # HTN, controlled -Switch metoprolol  tartrate to succinate 25 mg once daily, continue lisinopril 5 mg once daily and continue Jardiance 25 mg once daily.  Lisinopril 5 mg can be increased to 10 mg once daily (home dose) in outpatient setting if his BP continues to be elevated.  # Nicotine abuse -Patient motivated to quit smoking after discharge.  Disposition to home today  I have spent a total of 35 minutes with patient reviewing chart , telemetry, EKGs, labs and examining patient as well as establishing an assessment and plan that was discussed with the patient.  > 50% of time was spent in direct patient care.    Signed, Marjo Bicker, MD  02/25/2022, 9:44 AM

## 2022-02-25 NOTE — TOC Transition Note (Addendum)
Transition of Care [facility]) - CM/SW Discharge Note   Patient Details  Name: Gene Wright MRN: [id number] Date of Birth: May 10, 1960  [facility] CM/SW Contact:  Leone Haven, RN Phone Number: 02/25/2022, 9:39 AM   Clinical Narrative:    Patient is for dc today, he will get the 30 day free coupon for Brilinta, then he will have a follow up at the Cardiologist on 12/27 at 10 in [place], the NP there will change him to plavix per Tereso Newcomer PA. Patient is ok with this follow up information.  NCM called in the information from the coupon to [facility] in [place] and it went thru for the 30 day free.  He has no other needs.          Patient Goals and CMS Choice        Discharge Placement                       Discharge Plan and Services                                     Social Determinants of Health (SDOH) Interventions     Readmission Risk Interventions     No data to display

## 2022-02-25 NOTE — Discharge Summary (Addendum)
Discharge Summary    Patient ID: Gene Wright MRN: [id number]; DOB: 1960/06/04  Admit date: 02/23/2022 Discharge date: 02/25/2022  PCP:  Timmothy Euler, MD (Inactive)   [name] Providers Cardiologist:  Chalmers Guest, MD       Discharge Diagnoses    Principal Problem:   Acute ST elevation myocardial infarction (STEMI) of inferior wall ([name]) Active Problems:   HTN (hypertension)   Diabetes ([name])   Claudication in peripheral vascular disease ([name])   COPD (chronic obstructive pulmonary disease) ([name])   Hyperlipidemia LDL goal <70   CAD (coronary artery disease)   Heart failure with mildly reduced ejection fraction (HFmrEF) ([place])   Diagnostic Studies/Procedures    LEFT HEART CATH  02/23/2022 CONCLUSIONS: Occlusion of distal circumflex treated with PCI and overlapping stenting in the distal circumflex and into the third obtuse marginal, crossing over the distal circumflex.  0% stenosis was noted post procedure with TIMI grade III flow.  100% stenosis with TIMI grade 0 flow was noted before PCI.  Stents were postdilated to 3.0 mm in diameter proximally. 50% distal left main Ramus intermedius with ostial to proximal 70% narrowing RCA with segmental 70% narrowing in the mid vessel Mild mid inferior wall hypokinesis.  LVEDP 20 mmHg.  EF 55%.  Hemodynamics are consistent with acute diastolic heart failure  RECOMMENDATIONS: Aggressive risk factor modification, LDL less than 70, consider SGLT2 therapy,.  Dual antiplatelet therapy with aspirin and Brilinta, beta-blocker therapy, ARB therapy, and smoking cessation. Needs PAD workup.  Left leg greater than right claudication. Encouraged the patient to stop smoking.         ECHO COMPLETE WO IMAGING ENHANCING AGENT 02/23/2022 IMPRESSIONS 1. Left ventricular ejection fraction, by estimation, is 45 to 50%. The left ventricle has mildly decreased function. The left ventricle demonstrates regional wall  motion abnormalities (see scoring diagram/findings for description). There is mild left ventricular hypertrophy. Left ventricular diastolic parameters are indeterminate. 2. Right ventricular systolic function is normal. The right ventricular size is normal. There is normal pulmonary artery systolic pressure. The estimated right ventricular systolic pressure is 62.3 mmHg. 3. The mitral valve is normal in structure. No evidence of mitral valve regurgitation. No evidence of mitral stenosis. 4. The aortic valve is tricuspid. Aortic valve regurgitation is not visualized. No aortic stenosis is present. 5. The inferior vena cava is dilated in size with >50% respiratory variability, suggesting right atrial pressure of 8 mmHg.  LV Wall Scoring: The antero-lateral wall and posterior wall are hypokinetic. The entire anterior wall, entire septum, entire apex, and entire inferior wall are normal. _____________   History of Present Illness     Gene Wright is a 61 y.o. male with a history of diabetes mellitus, hypertension, hyperlipidemia, chronic kidney disease stage IIIa, former smoker, ETOH abuse, Chronic Obstructive Pulmonary Disease, horseshoe kidney, memory loss who presented to [facility] with chest pain on 02/23/22. EKG demonstrated Inf ST elevation. He was transferred to [facility] emergently for further management of Inferior STEMI.   Hospital Course     Consultants:   none   The patient was taken emergently to the cardiac catheterization lab by Dr. Tamala Julian upon arrival. Angiography demonstrated an occluded distal LCx. He underwent PCI with 2 overlapping DES in the dLCx and into the OM3. He tolerated the procedure well and had no immediate complications. Residual disease included dLM 50, ost and prox RI 70, mRCA 70. There was inf wall HK. LVEDP was 20 and EF was 55.  Hemodynamics were c/w acute (HFpEF) heart failure with preserved ejection fraction. An echocardiogram demonstrated mildly  reduced LVF with EF 45-50 w ant-lat and post HK. He was started on ASA, Brilinta, high dose statin (Lipitor 80), beta-blocker (metoprolol tartrate 25 twice daily). His BPs were soft. His dose of Lisinopril was reduced to 5 mg once daily and his beta-blocker was changed to Toprol XL 25 mg once daily. If his BP increases, his home dose of Lisinopril can be resumed at his f/u office visit.   Of note, the patient had symptoms of intermittent claudication. He will need outpatient workup initiated at his post hospital follow up visit.   His co-pay for Brilinta will be $400. He was given a 30 day free card for Brilinta. He will need to be transitioned to Plavix after 30 days at his f/u office visit (instructions left in notes).   He was seen by Dr. Dellia Cloud this morning who felt the patient is in stable condition and ready for DC to home.      Did the patient have an acute coronary syndrome (MI, NSTEMI, STEMI, etc) this admission?:  Yes                               AHA/ACC Clinical Performance & Quality Measures: Aspirin prescribed? - Yes ADP Receptor Inhibitor (Plavix/Clopidogrel, Brilinta/Ticagrelor or Effient/Prasugrel) prescribed (includes medically managed patients)? - Yes Beta Blocker prescribed? - Yes High Intensity Statin (Lipitor 40-45m or Crestor 20-475m prescribed? - Yes EF assessed during THIS hospitalization? - Yes For EF <40%, was ACEI/ARB prescribed? - Yes For EF <40%, Aldosterone Antagonist (Spironolactone or Eplerenone) prescribed? - Not Applicable (EF >/= 4042%Cardiac Rehab Phase II ordered (including medically managed patients)? - Yes      The patient will be scheduled for a TOC follow up appointment in 7 days (12/27 at 10 am with ElFinis BudNP at 10 am).  A [facility] the [facility] at the office where the patient should be seen for follow up.  _____________  Discharge Vitals Blood pressure 117/83, pulse 97, temperature 97.6 F (36.4  C), temperature source Oral, resp. rate 17, SpO2 95 %.  There were no vitals filed for this visit.  Labs & Radiologic Studies    CBC Recent Labs    02/24/22 0143 02/25/22 0934  WBC 9.8 8.1  HGB 13.5 15.1  HCT 39.1 44.1  MCV 93.8 95.2  PLT 233 25595 Basic Metabolic Panel Recent Labs    02/23/22 2120 02/24/22 0143  NA 137 138  K 3.8 3.7  CL 111 110  CO2 20* 22  GLUCOSE 129* 124*  BUN 10 11  CREATININE 1.02 0.91  CALCIUM 8.4* 8.6*  MG 2.1  --     High Sensitivity Troponin:   Recent Labs  Lab 02/24/22 0615  TROPONINIHS >24,000*     Fasting Lipid Panel Recent Labs    02/24/22 0143  CHOL 161  HDL 31*  LDLCALC 86  TRIG 218*  CHOLHDL 5.2   _____________    Disposition   Pt is being discharged home today in good condition.  Follow-up Plans & Appointments     Discharge Instructions     Amb Referral to Cardiac Rehabilitation   Complete by: As directed    Diagnosis:  Coronary Stents STEMI     After initial evaluation and assessments completed: Virtual Based Care may be provided alone  or in conjunction with Phase 2 Cardiac Rehab based on patient barriers.: Yes   Intensive Cardiac Rehabilitation (ICR) [place] location only OR Traditional Cardiac Rehabilitation (TCR) *If criteria for ICR are not met will enroll in TCR [facility] only): Yes   Diet - low sodium heart healthy   Complete by: As directed    Discharge wound care:   Complete by: As directed    Call [phone] for any swelling, bleeding bruising or fever.   Driving Restrictions   Complete by: As directed    Do not drive for 2 weeks   Increase activity slowly   Complete by: As directed    Lifting restrictions   Complete by: As directed    Do not lift over 5 lbs for 2 weeks   Sexual Activity Restrictions   Complete by: As directed    None for 2 weeks        Discharge Medications   Allergies as of 02/25/2022       Reactions   Biaxin [clarithromycin] Hives, Itching   Metformin    Makes  me sick   Hydroxyzine Itching        Medication List     STOP taking these medications    rosuvastatin 20 MG tablet Commonly known as: CRESTOR       TAKE these medications    acetaminophen 325 MG tablet Commonly known as: TYLENOL Take 2 tablets (650 mg total) by mouth every 6 (six) hours as needed for headache or mild pain.   aspirin 81 MG tablet Take 81 mg by mouth daily.   atorvastatin 80 MG tablet Commonly known as: LIPITOR Take 1 tablet (80 mg total) by mouth daily.   cetirizine 10 MG tablet Commonly known as: ZYRTEC Take 1 tablet (10 mg total) by mouth daily.   Jardiance 25 MG Tabs tablet Generic drug: empagliflozin Take 25 mg by mouth daily.   lisinopril 5 MG tablet Commonly known as: ZESTRIL Take 1 tablet (5 mg total) by mouth daily. What changed:  medication strength how much to take   metoprolol succinate 25 MG 24 hr tablet Commonly known as: TOPROL-XL Take 1 tablet (25 mg total) by mouth daily.   montelukast 10 MG tablet Commonly known as: SINGULAIR Take 1 tablet (10 mg total) by mouth at bedtime.   ticagrelor 90 MG Tabs tablet Commonly known as: BRILINTA Take 1 tablet (90 mg total) by mouth 2 (two) times daily. You will change to Plavix after 30 days               Discharge Care Instructions  (From admission, onward)           Start     Ordered   02/25/22 0000  Discharge wound care:       Comments: Call [phone] for any swelling, bleeding bruising or fever.   02/25/22 1033               Outstanding Labs/Studies   Lipids and LFTs in 8 weeks   Duration of Discharge Encounter   Greater than 65 minutes including physician time.  Signed, Kaileb Monsanto Fidel Levy, MD [place]

## 2022-02-25 NOTE — Discharge Instructions (Signed)
Make sure you take Brilinta 90 mg twice daily along with aspirin 81 mg once daily every day. When you have your follow up 12/27, you will be given a prescription for Plavix 75 mg once daily which will take the place of Brilinta after 30 days.

## 2022-02-26 ENCOUNTER — Telehealth: Payer: Self-pay | Admitting: *Deleted

## 2022-02-26 LAB — HEMOGLOBIN A1C
Hgb A1c MFr Bld: 6.5 % — ABNORMAL HIGH (ref 4.8–5.6)
Mean Plasma Glucose: 140 mg/dL

## 2022-02-26 LAB — LIPOPROTEIN A (LPA): Lipoprotein (a): 189.8 nmol/L — ABNORMAL HIGH (ref <75.0)

## 2022-02-26 NOTE — Telephone Encounter (Signed)
-----   Message from Beatrice Lecher, [place] sent at 02/25/2022 10:20 AM EST ----- Regarding: Hospital Follow Up Primary Cardiologist:  [place] DC from hospital on 02/25/2022  Pt has an appt with Lanora Manis 12/27. He will need a phone call in 48 hours for TOC appt. Signed,  Tereso Newcomer, PA-C   02/25/2022 10:20 AM

## 2022-02-27 NOTE — Telephone Encounter (Signed)
Patient contacted regarding discharge from [facility] on 02/25/2022.  Patient understands to follow up with provider Sharlene Dory on 03/07/2022 at 10:00 am at [facility]. Patient understands his discharge instructions. Patient understands his medications and regimen. Patient understands to bring all his medications to this visit. Voices no complaints or concerns at this time.

## 2022-03-07 ENCOUNTER — Encounter: Payer: Self-pay | Admitting: Nurse Practitioner

## 2022-03-07 ENCOUNTER — Ambulatory Visit: Payer: BLUE CROSS/BLUE SHIELD | Attending: Nurse Practitioner | Admitting: Nurse Practitioner

## 2022-03-07 VITALS — BP 124/76 | HR 72 | Ht 63.0 in | Wt 150.0 lb

## 2022-03-07 DIAGNOSIS — I10 Essential (primary) hypertension: Secondary | ICD-10-CM

## 2022-03-07 DIAGNOSIS — I252 Old myocardial infarction: Secondary | ICD-10-CM

## 2022-03-07 DIAGNOSIS — I739 Peripheral vascular disease, unspecified: Secondary | ICD-10-CM

## 2022-03-07 DIAGNOSIS — I2510 Atherosclerotic heart disease of native coronary artery without angina pectoris: Secondary | ICD-10-CM | POA: Diagnosis not present

## 2022-03-07 DIAGNOSIS — E118 Type 2 diabetes mellitus with unspecified complications: Secondary | ICD-10-CM

## 2022-03-07 DIAGNOSIS — Z79899 Other long term (current) drug therapy: Secondary | ICD-10-CM

## 2022-03-07 DIAGNOSIS — I255 Ischemic cardiomyopathy: Secondary | ICD-10-CM

## 2022-03-07 DIAGNOSIS — E785 Hyperlipidemia, unspecified: Secondary | ICD-10-CM

## 2022-03-07 MED ORDER — NITROGLYCERIN 0.4 MG SL SUBL: 0.4000 mg | SUBLINGUAL_TABLET | SUBLINGUAL | 3 refills | Status: AC | PRN

## 2022-03-07 MED ORDER — CLOPIDOGREL BISULFATE 75 MG PO TABS: 75.0000 mg | ORAL_TABLET | Freq: Every day | ORAL | 4 refills | Status: DC

## 2022-03-07 MED ORDER — CLOPIDOGREL BISULFATE 75 MG PO TABS: 600.0000 mg | ORAL_TABLET | Freq: Once | ORAL | 0 refills | Status: AC

## 2022-03-07 NOTE — Progress Notes (Signed)
Cardiology Office Note:    Date:  03/07/2022   ID:  Gene Wright, DOB [birth year], MRN [id number]  PCP:  Viona Gilmore, [place] [facility] Cardiologist:  Chalmers Guest, MD     Referring MD: No ref. provider found   CC: Here for follow-up post cardiac catheterization  History of Present Illness:    Gene Wright is a 61 y.o. male with a hx of the following:  CAD, s/p PCI with 2 overlapping DES in dLCx and into OM3 (02/2022) T2DM COPD HTN HLD CKD stage 3a Memory loss [place] kidney Former smoker History of alcohol abuse Insomnia   Previous cardiac history includes stress test in 2022 that was negative for ischemia.  Test was stopped due to severe hypertension during testing.  Does have a positive family cardiac history for CAD, father had a heart attack in the past.  Patient is a very delightful 61 year old male with past medical history as mentioned above.  He presented to [facility] in 02/2022 and was transferred to [facility] for evaluation of inferior STEMI as seen on EKG.  Was taken to the Cath Lab and underwent left heart catheterization on 02/23/2022 that revealed occlusion of distal circumflex that was treated with PCI and overlapping stenting in the distal circumflex and into the third obtuse marginal, crossing over the distal circumflex.  50% distal left main, ramus intermedius with ostial to proximal 70% narrowing, RCA with segmental 70% narrowing in mid vessel, mild mid inferior wall hypokinesis, EF 55%, LVEDP 20 mmHg, hemodynamics were consistent with acute diastolic heart failure.  Recommendations included aggressive risk factor modification, consideration of SGLT2 therapy.  DAPT including aspirin and Brilinta included, beta-blocker therapy, ARB therapy, and smoking cessation recommended.  It was also recommended that patient needed PAD workup, left leg greater than right claudication.  During hospitalization,  echocardiogram was performed and revealed EF mildly reduced at 45 to 50%, left ventricle demonstrated RWMA, mild LVH, normal PASP, no significant valvular abnormalities.Before d/c in the hospital, blood pressures were soft.  Lisinopril dosage was reduced to 5 mg once daily, beta-blocker changed to Toprol-XL 25 mg once daily.  It was recommended if his blood pressure increases, home dose of lisinopril could be resumed at office follow-up visit.  At follow-up visit, was also recommended he needed PAD workup.  Due to cost of Brilinta, patient would need to be transitioned to Plavix after 30 days at his follow-up visit.  Was discharged in stable condition on February 25, 2022.  Today he presents for office follow-up.  He denies any anginal pain; however he does admit to rare episodes of tingling sensation in between his shoulder blades, only has occurred once or twice. Denies any similar symptoms of chest pain that lead to his presentation to ED. Did admit to episode of SHOB and HR was around 140's transient, didn't last long, hasn't recurred since, did admit to caffeine use around that time. Denies any recent worsening shortness of breath, palpitations, orthopnea, PND, swelling, significant weight changes, acute bleeding. Does admit to claudication, more noticed as a pressure along his left calf muscle. Did bring in paperwork to be filled out. Works in [employer] and wants to know when he can go back to work. Requesting a Rx for Nitroglycerin. Denies any other questions or concerns today. He states he has stopped alcohol use and tobacco use.   Past Medical History:  Diagnosis Date   CAD (coronary artery disease) 02/25/2022   S/p  Inf STEMI 02/2022 >> s/p overlapping DES (2.25 x 20 mm Synergy and 2 x 12 mm Synergy) to the distal LCx and into the OM3 Residual CAD: dLM 50, ost and prox RI 70, mRCA 70   Colon polyps    Constipation    Diabetes mellitus without complication (HCC)    Heart failure with mildly  reduced ejection fraction (HFmrEF) ([place]) 02/25/2022   Ischemic CM // TTE 02/23/22: EF 45-50, ant-lat and post HK, NL RVSF, NL PASP (RVSP 23.2), RAP 8   Hypercholesteremia    Hypertension     Past Surgical History:  Procedure Laterality Date   CIRCUMCISION     COLONOSCOPY     COLONOSCOPY N/A 09/09/2013   Procedure: COLONOSCOPY;  Surgeon: Rogene Houston, MD;  Location: [facility];  Service: Endoscopy;  Laterality: N/A;  930   CORONARY STENT INTERVENTION N/A 02/23/2022   Procedure: CORONARY STENT INTERVENTION;  Surgeon: Belva Crome, MD;  Location: [place] [facility] LAB;  Service: Cardiovascular;  Laterality: N/A;   LEFT HEART CATH AND CORONARY ANGIOGRAPHY N/A 02/23/2022   Procedure: LEFT HEART CATH AND CORONARY ANGIOGRAPHY;  Surgeon: Belva Crome, MD;  Location: [place] [facility];  Service: Cardiovascular;  Laterality: N/A;    Current Medications: Current Meds  Medication Sig   acetaminophen (TYLENOL) 325 MG tablet Take 2 tablets (650 mg total) by mouth every 6 (six) hours as needed for headache or mild pain.   aspirin 81 MG tablet Take 81 mg by mouth daily.   atorvastatin (LIPITOR) 80 MG tablet Take 1 tablet (80 mg total) by mouth daily.   JARDIANCE 25 MG TABS tablet Take 25 mg by mouth daily.   lisinopril (ZESTRIL) 5 MG tablet Take 1 tablet (5 mg total) by mouth daily.   metoprolol succinate (TOPROL-XL) 25 MG 24 hr tablet Take 1 tablet (25 mg total) by mouth daily.   nitroGLYCERIN (NITROSTAT) 0.4 MG SL tablet Place 1 tablet (0.4 mg total) under the tongue every 5 (five) minutes as needed for chest pain.   Vitamin D, Ergocalciferol, (DRISDOL) 1.25 MG (50000 UNIT) CAPS capsule Take 50,000 Units by mouth once a week.   ticagrelor (BRILINTA) 90 MG TABS tablet Take 1 tablet (90 mg total) by mouth 2 (two) times daily. You will change to Plavix after 30 days     Allergies:   Biaxin [clarithromycin], Metformin, and Hydroxyzine   Social History   Socioeconomic History   Marital  status: Married    Spouse name: Not on file   Number of children: Not on file   Years of education: Not on file   Highest education level: Not on file  Occupational History   Not on file  Tobacco Use   Smoking status: Former    Packs/day: 3.00    Years: 32.00    Total pack years: 96.00    Types: Cigars, Cigarettes    Quit date: 02/23/2022    Years since quitting: 0.0   Smokeless tobacco: Not on file   Tobacco comments:    trying to quit  Vaping Use   Vaping Use: Never used  Substance and Sexual Activity   Alcohol use: Yes    Alcohol/week: 14.0 standard drinks of alcohol    Types: 14 Cans of beer per week    Comment:  1-2 beers a day or every other day   Drug use: No   Sexual activity: Not on file  Other Topics Concern   Not on file  Social History  Narrative   Not on file   Social Determinants of Health   Financial Resource Strain: Not on file  Food Insecurity: No Food Insecurity (02/23/2022)   Hunger Vital Sign    Worried About Running Out of Food in the Last Year: Never true    Ran Out of Food in the Last Year: Never true  Transportation Needs: No Transportation Needs (02/23/2022)   PRAPARE - Administrator, Civil Service (Medical): No    Lack of Transportation (Non-Medical): No  Physical Activity: Not on file  Stress: Not on file  Social Connections: Not on file     Family History: The patient's family history includes Cancer in his paternal grandfather; Coronary artery disease in his father; Diabetes in his maternal grandfather, maternal grandmother, paternal grandfather, and paternal grandmother. There is no history of Colon cancer.  ROS:   Review of Systems  Constitutional: Negative.   HENT: Negative.    Eyes: Negative.   Respiratory: Negative.         See HPI.   Cardiovascular:  Positive for claudication. Negative for chest pain, palpitations, orthopnea, leg swelling and PND.       See HPI.   Gastrointestinal: Negative.   Genitourinary:  Negative.   Musculoskeletal:  Positive for back pain. Negative for falls, joint pain, myalgias and neck pain.       See HPI.   Skin: Negative.   Neurological: Negative.   Endo/Heme/Allergies: Negative.   Psychiatric/Behavioral: Negative.      Please see the history of present illness.    All other systems reviewed and are negative.  EKGs/Labs/Other Studies Reviewed:    The following studies were reviewed today:   EKG:  EKG is not ordered today.   2D echocardiogram on 02/23/2022: 1. Left ventricular ejection fraction, by estimation, is 45 to 50%. The  left ventricle has mildly decreased function. The left ventricle  demonstrates regional wall motion abnormalities (see scoring  diagram/findings for description). There is mild left  ventricular hypertrophy. Left ventricular diastolic parameters are  indeterminate.   2. Right ventricular systolic function is normal. The right ventricular  size is normal. There is normal pulmonary artery systolic pressure. The  estimated right ventricular systolic pressure is 23.2 mmHg.   3. The mitral valve is normal in structure. No evidence of mitral valve  regurgitation. No evidence of mitral stenosis.   4. The aortic valve is tricuspid. Aortic valve regurgitation is not  visualized. No aortic stenosis is present.   5. The inferior vena cava is dilated in size with >50% respiratory  variability, suggesting right atrial pressure of 8 mmHg.    Left heart cath and coronary angiography on February 23, 2022:   3rd Mrg lesion is 60% stenosed.   Post intervention, there is a 0% residual stenosis.   CONCLUSIONS: Occlusion of distal circumflex treated with PCI and overlapping stenting in the distal circumflex and into the third obtuse marginal, crossing over the distal circumflex.  0% stenosis was noted post procedure with TIMI grade III flow.  100% stenosis with TIMI grade 0 flow was noted before PCI.  Stents were postdilated to 3.0 mm in diameter  proximally. 50% distal left main Ramus intermedius with ostial to proximal 70% narrowing RCA with segmental 70% narrowing in the mid vessel Mild mid inferior wall hypokinesis.  LVEDP 20 mmHg.  EF 55%.  Hemodynamics are consistent with acute diastolic heart failure   RECOMMENDATIONS: Aggressive risk factor modification, LDL less than 70, consider SGLT2 therapy,.  Dual antiplatelet therapy with aspirin and Brilinta, beta-blocker therapy, ARB therapy, and smoking cessation. Needs PAD workup.  Left leg greater than right claudication. Encouraged the patient to stop smoking.   Recent Labs: 02/23/2022: Magnesium 2.1 02/25/2022: ALT 34; BUN 15; Creatinine, Ser 0.95; Hemoglobin 15.1; Platelets 258; Potassium 4.5; Sodium 138  Recent Lipid Panel    Component Value Date/Time   CHOL 161 02/24/2022 0143   TRIG 218 (H) 02/24/2022 0143   HDL 31 (L) 02/24/2022 0143   CHOLHDL 5.2 02/24/2022 0143   VLDL 44 (H) 02/24/2022 0143   LDLCALC 86 02/24/2022 0143   Physical Exam:    VS:  BP 124/76   Pulse 72   Ht 5\' 3"  (1.6 m)   Wt 150 lb (68 kg)   SpO2 98%   BMI 26.57 kg/m     Wt Readings from Last 3 Encounters:  03/07/22 150 lb (68 kg)  02/25/22 149 lb 9.6 oz (67.9 kg)  06/28/15 163 lb (73.9 kg)     GEN: Well nourished, well developed in no acute distress HEENT: Normal NECK: No JVD; No carotid bruits CARDIAC: S1/S2, RRR, no murmurs, rubs, gallops; 2+ peripheral pulses throughout, strong and equal bilaterally; right radial site from cardiac catheterization is healing well, no hematoma, erythema, swelling, bruising, or signs or symptoms of infection noted. RESPIRATORY:  Clear and diminished to auscultation without rales, wheezing or rhonchi  MUSCULOSKELETAL:  No edema; No deformity  SKIN: Warm and dry NEUROLOGIC:  Alert and oriented x 3 PSYCHIATRIC:  Normal affect   ASSESSMENT:    1. Coronary artery disease involving native heart without angina pectoris, unspecified vessel or lesion type    2. History of ST elevation myocardial infarction (STEMI)   3. Ischemic cardiomyopathy   4. Hyperlipidemia, unspecified hyperlipidemia type   5. Medication management   6. Hypertension, unspecified type   7. Type 2 diabetes mellitus with complication, without long-term current use of insulin ([place])   8. Claudication in peripheral vascular disease ([place])   9. PAD (peripheral artery disease) (HCC)    PLAN:    In order of problems listed above:  CAD, s/p STEMI with PCI with 2 overlapping DES in dLCx and into OM3 Stable with no recent anginal symptoms. No indication for ischemic evaluation.  Continue aspirin, Lipitor, lisinopril, and Toprol-XL. Will Rx NG PRN for chest pain. Will switch Brilinta to Plavix d/t cost per protocol. Instructed patient and wife that after 12 hours after last dose of Brilinta, take loading dose of Plavix (600 mg total) starting tomorrow, with maintenance dose of Plavix 75 mg daily thereafter starting on March 09, 2022. Will recheck CBC within the following week. No complications post cardiac cath. Will write work note to remain out of work until I see him back for follow-up. Okay to start cardiac rehab.     Cardiac Rehabilitation Eligibility Assessment  The patient is ready to start cardiac rehabilitation from a cardiac standpoint.     2.  Ischemic cardiomyopathy Follow-up echocardiogram after heart cath revealed EF mildly reduced at 45 to 50%, regional wall motion abnormalities were demonstrated along left ventricle, mild LVH.  Continue aspirin, Lipitor, Jardiance, lisinopril, and Toprol-XL.  Switching Brilinta to Plavix as mentioned above.  Plan to repeat echocardiogram in 3 months to see if EF has improved. Low sodium diet, fluid restriction <2L, and daily weights encouraged. Educated to contact our office for weight gain of 2 lbs overnight or 5 lbs in one week.  3. HLD Lipid profile during hospitalization  revealed total cholesterol 161, HDL 31, LDL 86, and  triglycerides 218.  Was started on atorvastatin during hospitalization.  Will obtain FLP and LFT in 2 months per protocol. Heart healthy diet and regular cardiovascular exercise encouraged.   4. HTN Blood pressure today stable.  Denies any BP issues at home. Discussed to monitor BP at home at least 2 hours after medications and sitting for 5-10 minutes.  Continue current medication regimen. Heart healthy diet and regular cardiovascular exercise encouraged.   5. T2DM Recent hemoglobin A1c 6.5%.  Continue Jardiance.  Currently being managed by PCP. Heart healthy, diabetic diet and regular cardiovascular exercise encouraged. Continue to follow with PCP.  6. Claudication, PAD Does admit to claudication symptoms (L>R), particularly noticed along left lower extremity.  Will obtain ABIs and lower extremity arterial duplex per protocol.  Continue current medication regimen. Heart healthy diet and regular cardiovascular exercise encouraged.   7.  Disposition: Follow-up with me in 8 weeks or sooner if anything changes.  Medication Adjustments/Labs and Tests Ordered: Current medicines are reviewed at length with the patient today.  Concerns regarding medicines are outlined above.  Orders Placed This Encounter  Procedures   CBC   Lipid Profile   Hepatic function panel   ECHOCARDIOGRAM COMPLETE   VAS [place] LOWER EXT ART SEG MULTI (SEGMENTALS & LE RAYNAUDS)   Meds ordered this encounter  Medications   clopidogrel (PLAVIX) 75 MG tablet    Sig: Take 8 tablets (600 mg total) by mouth once for 1 dose. 12 hours after last ticagrelor dose    Dispense:  8 tablet    Refill:  0    Order Specific Question:   Supervising Provider    Answer:   Antoine Poche [[id number]]   clopidogrel (PLAVIX) 75 MG tablet    Sig: Take 1 tablet (75 mg total) by mouth daily.    Dispense:  90 tablet    Refill:  4    Order Specific Question:   Supervising Provider    Answer:   Antoine Poche [[id number]]   nitroGLYCERIN  (NITROSTAT) 0.4 MG SL tablet    Sig: Place 1 tablet (0.4 mg total) under the tongue every 5 (five) minutes as needed for chest pain.    Dispense:  25 tablet    Refill:  3    Patient Instructions  Medication Instructions:  STOP Brilinta after tonight  TOMORROW, take Plavix 600 mg (8 - 75 mg tablets) for one dose. THEN take 75 mg (1 tablet) daily thereafter    Take nitroglycerin as directed for chest pain  Labwork: CBC in 1 week at [facility]  Lipids and LFT's in 2 months  Testing/Procedures: Your physician has requested that you have a lower extremity arterial exercise duplex. During this test, exercise and ultrasound are used to evaluate arterial blood flow in the legs. Allow one hour for this exam. There are no restrictions or special instructions.     Your physician has requested that you have an echocardiogram in 3 months. Echocardiography is a painless test that uses sound waves to create images of your heart. It provides your doctor with information about the size and shape of your heart and how well your heart's chambers and valves are working. This procedure takes approximately one hour. There are no restrictions for this procedure. Please do NOT wear cologne, perfume, aftershave, or lotions (deodorant is allowed). Please arrive 15 minutes prior to your appointment time.  Follow-Up: 2 months  Any Other Special Instructions Will  Be Listed Below (If Applicable).  If you need a refill on your cardiac medications before your next appointment, please call your pharmacy.    Signed, Finis Bud, NP  03/07/2022 2:11 PM    [place]

## 2022-03-07 NOTE — Patient Instructions (Signed)
Medication Instructions:  STOP Brilinta after tonight  TOMORROW, take Plavix 600 mg (8 - 75 mg tablets) for one dose. THEN take 75 mg (1 tablet) daily thereafter    Take nitroglycerin as directed for chest pain  Labwork: CBC in 1 week at [facility]  Lipids and LFT's in 2 months  Testing/Procedures: Your physician has requested that you have a lower extremity arterial exercise duplex. During this test, exercise and ultrasound are used to evaluate arterial blood flow in the legs. Allow one hour for this exam. There are no restrictions or special instructions.     Your physician has requested that you have an echocardiogram in 3 months. Echocardiography is a painless test that uses sound waves to create images of your heart. It provides your doctor with information about the size and shape of your heart and how well your heart's chambers and valves are working. This procedure takes approximately one hour. There are no restrictions for this procedure. Please do NOT wear cologne, perfume, aftershave, or lotions (deodorant is allowed). Please arrive 15 minutes prior to your appointment time.  Follow-Up: 2 months  Any Other Special Instructions Will Be Listed Below (If Applicable).  If you need a refill on your cardiac medications before your next appointment, please call your pharmacy.

## 2022-03-08 ENCOUNTER — Telehealth: Payer: Self-pay | Admitting: Nurse Practitioner

## 2022-03-08 ENCOUNTER — Other Ambulatory Visit: Payer: Self-pay | Admitting: Nurse Practitioner

## 2022-03-08 DIAGNOSIS — I739 Peripheral vascular disease, unspecified: Secondary | ICD-10-CM

## 2022-03-08 NOTE — Telephone Encounter (Signed)
Pt c/o medication issue:  1. Name of Medication:   aspirin 81 MG tablet    clopidogrel (PLAVIX) 75 MG tablet    2. How are you currently taking this medication (dosage and times per day)?   3. Are you having a reaction (difficulty breathing--STAT)?   4. What is your medication issue? Pt spouse wants to know if its safe to take these medication together but she read on the plavix bottle that there is an interaction between them.

## 2022-03-08 NOTE — Telephone Encounter (Signed)
Pt should continue on both aspirin and clopidogrel, he recently had an MI and PCI and both meds are indicated for him to take.

## 2022-03-08 NOTE — Telephone Encounter (Signed)
Patient informed and verbalized understanding of plan. No DPR on file for wife

## 2022-03-20 ENCOUNTER — Ambulatory Visit: Payer: BLUE CROSS/BLUE SHIELD | Attending: Nurse Practitioner

## 2022-03-20 DIAGNOSIS — I739 Peripheral vascular disease, unspecified: Secondary | ICD-10-CM | POA: Diagnosis not present

## 2022-03-21 ENCOUNTER — Telehealth: Payer: Self-pay | Admitting: Nurse Practitioner

## 2022-03-21 ENCOUNTER — Telehealth: Payer: Self-pay | Admitting: *Deleted

## 2022-03-21 DIAGNOSIS — I739 Peripheral vascular disease, unspecified: Secondary | ICD-10-CM

## 2022-03-21 LAB — VAS US LOWER EXT ART SEG MULTI (SEGMENTALS & LE RAYNAUDS)
Left ABI: 0.73
Right ABI: 1

## 2022-03-21 NOTE — Telephone Encounter (Signed)
-----   Message from Finis Bud, NP sent at 03/21/2022 10:43 AM EST ----- Results reviewed. Please refer patient to Peripheral Vascular for diagnosis of PAD. He has 50-74% narrowing in artery in right leg and total blockage in right posterior tibial artery. Total blockage also noted along left superficial femoral artery. Please get patient for first available appointment to be seen right away.   Thanks! Finis Bud, AGNP-C

## 2022-03-21 NOTE — Telephone Encounter (Signed)
Follow Up:     Patient's wife is calling to check on the status of patient's disability forms that was left on 03-07-22.

## 2022-03-21 NOTE — Telephone Encounter (Signed)
Patient informed and verbalized understanding of plan. Copy sent to PCP 

## 2022-03-23 ENCOUNTER — Encounter (HOSPITAL_COMMUNITY)
Admission: RE | Admit: 2022-03-23 | Discharge: 2022-03-23 | Disposition: A | Discharge status: Home / Self Care | Payer: BLUE CROSS/BLUE SHIELD | Source: Ambulatory Visit | Attending: Interventional Cardiology | Admitting: Interventional Cardiology

## 2022-03-23 LAB — GLUCOSE, CAPILLARY: Glucose-Capillary: 98 mg/dL (ref 70–99)

## 2022-03-23 NOTE — Progress Notes (Addendum)
Incomplete Session Note  Patient Details  Name: Gene Wright MRN: [id number] Date of Birth: 06-27-1960 Referring Provider:    Glyn Ade did not complete his rehab session.  After reviewing with patient his projected out-of-pocket cost for the program, he said he was not able to pay that amount at present. He is currently not working. He is walking everyday for 30 minutes. Information provided on diet and exercise and blood pressure goals. He and his wife verbalized understanding.

## 2022-03-26 ENCOUNTER — Encounter (HOSPITAL_COMMUNITY): Payer: BLUE CROSS/BLUE SHIELD

## 2022-03-27 ENCOUNTER — Encounter: Payer: Self-pay | Admitting: Cardiovascular Disease

## 2022-03-27 ENCOUNTER — Ambulatory Visit: Payer: BLUE CROSS/BLUE SHIELD | Attending: Cardiovascular Disease | Admitting: Cardiovascular Disease

## 2022-03-27 VITALS — BP 118/80 | HR 79 | Ht 63.0 in | Wt 151.0 lb

## 2022-03-27 DIAGNOSIS — I739 Peripheral vascular disease, unspecified: Secondary | ICD-10-CM

## 2022-03-27 NOTE — Progress Notes (Signed)
03/27/2022 Gene Wright   1961-01-19  [id number]  Primary Physician Viona Gilmore, PA-C Primary Cardiologist: Lorretta Harp MD Gene Wright, [place]  HPI:  Gene Wright is a 62 y.o. thin-appearing married African-American male father of no children who is accompanied by his wife Gene Wright.  He was referred by Finis Bud, NP for evaluation of symptomatic PAD.  He works in a [employer].  His cardiac risk factors are notable for 35 pack years of tobacco abuse having quit smoking cigars at the time of his STEMI.  History of hypertension, hyperlipidemia and diabetes.  His father died of a myocardial infarction at age 20.  He had a STEMI on 02/23/2022 and had circumflex and obtuse marginal branch intervention by Dr. Tamala Julian with scattered moderate disease otherwise and mild to moderate LV dysfunction.  He is on dual antiplatelet therapy.  He no longer has chest pain or shortness of breath.  He does have left calf claudication which she has had for several years thus lifestyle-limiting.  Dopplers performed 03/20/2022 revealed a right ABI of 1.0 and a left of 0.73 with an occluded left SFA.   Current Meds  Medication Sig   acetaminophen (TYLENOL) 325 MG tablet Take 2 tablets (650 mg total) by mouth every 6 (six) hours as needed for headache or mild pain.   aspirin 81 MG tablet Take 81 mg by mouth daily.   atorvastatin (LIPITOR) 80 MG tablet Take 1 tablet (80 mg total) by mouth daily.   buPROPion (WELLBUTRIN SR) 150 MG 12 hr tablet Take 150 mg by mouth 2 (two) times daily.   cetirizine (ZYRTEC) 10 MG tablet Take 1 tablet (10 mg total) by mouth daily.   clopidogrel (PLAVIX) 75 MG tablet Take 1 tablet (75 mg total) by mouth daily.   JARDIANCE 25 MG TABS tablet Take 25 mg by mouth daily.   lisinopril (ZESTRIL) 5 MG tablet Take 1 tablet (5 mg total) by mouth daily.   metoprolol succinate (TOPROL-XL) 25 MG 24 hr tablet Take 1 tablet (25 mg total) by mouth daily.   montelukast  (SINGULAIR) 10 MG tablet Take 1 tablet (10 mg total) by mouth at bedtime.   nitroGLYCERIN (NITROSTAT) 0.4 MG SL tablet Place 1 tablet (0.4 mg total) under the tongue every 5 (five) minutes as needed for chest pain.   omeprazole (PRILOSEC) 40 MG capsule Take 40 mg by mouth daily.   Vitamin D, Ergocalciferol, (DRISDOL) 1.25 MG (50000 UNIT) CAPS capsule Take 50,000 Units by mouth once a week.     Allergies  Allergen Reactions   Biaxin [Clarithromycin] Hives and Itching   Metformin     Makes me sick   Hydroxyzine Itching    Social History   Socioeconomic History   Marital status: Married    Spouse name: Not on file   Number of children: Not on file   Years of education: Not on file   Highest education level: Not on file  Occupational History   Not on file  Tobacco Use   Smoking status: Former    Packs/day: 3.00    Years: 32.00    Total pack years: 96.00    Types: Cigars, Cigarettes    Quit date: 02/23/2022    Years since quitting: 0.0   Smokeless tobacco: Not on file   Tobacco comments:    trying to quit  Vaping Use   Vaping Use: Never used  Substance and Sexual Activity   Alcohol use: Yes  Alcohol/week: 14.0 standard drinks of alcohol    Types: 14 Cans of beer per week    Comment:  1-2 beers a day or every other day   Drug use: No   Sexual activity: Not on file  Other Topics Concern   Not on file  Social History Narrative   Not on file   Social Determinants of Health   Financial Resource Strain: Not on file  Food Insecurity: No Food Insecurity (02/23/2022)   Hunger Vital Sign    Worried About Running Out of Food in the Last Year: Never true    Ran Out of Food in the Last Year: Never true  Transportation Needs: No Transportation Needs (02/23/2022)   PRAPARE - Hydrologist (Medical): No    Lack of Transportation (Non-Medical): No  Physical Activity: Not on file  Stress: Not on file  Social Connections: Not on file  Intimate  Partner Violence: Not At Risk (02/23/2022)   Humiliation, Afraid, Rape, and Kick questionnaire    Fear of Current or Ex-Partner: No    Emotionally Abused: No    Physically Abused: No    Sexually Abused: No     Review of Systems: General: negative for chills, fever, night sweats or weight changes.  Cardiovascular: negative for chest pain, dyspnea on exertion, edema, orthopnea, palpitations, paroxysmal nocturnal dyspnea or shortness of breath Dermatological: negative for rash Respiratory: negative for cough or wheezing Urologic: negative for hematuria Abdominal: negative for nausea, vomiting, diarrhea, bright red blood per rectum, melena, or hematemesis Neurologic: negative for visual changes, syncope, or dizziness All other systems reviewed and are otherwise negative except as noted above.    Blood pressure 118/80, pulse 79, height 5\' 3"  (1.6 m), weight 151 lb (68.5 kg).  General appearance: alert and no distress Neck: no adenopathy, no carotid bruit, no JVD, supple, symmetrical, trachea midline, and thyroid not enlarged, symmetric, no tenderness/mass/nodules Lungs: clear to auscultation bilaterally Heart: regular rate and rhythm, S1, S2 normal, no murmur, click, rub or gallop Extremities: extremities normal, atraumatic, no cyanosis or edema Pulses: Diminished left pedal pulse Skin: Skin color, texture, turgor normal. No rashes or lesions Neurologic: Grossly normal  EKG not performed today  ASSESSMENT AND PLAN:   Peripheral arterial disease [facility]) Mr. Tippin was referred to me by Finis Bud, NP for evaluation of symptomatic PAD.  He does have a history of CAD status post post recent STEMI 02/23/2022.  He has risk factors including recently discontinue tobacco abuse, treated hypertension, hyperlipidemia and diabetes as well as family history.  He has had left calf claudication for several years.  Recent Doppler studies performed 03/20/2022 revealed a right ABI of 1.0, left of 0.73  with occluded mid left SFA.  He wishes to proceed with outpatient peripheral angiography and endovascular therapy.     Lorretta Harp MD FACP,FACC,FAHA, [facility] 03/27/2022 2:46 PM

## 2022-03-27 NOTE — Assessment & Plan Note (Signed)
Gene Wright was referred to me by Finis Bud, NP for evaluation of symptomatic PAD.  He does have a history of CAD status post post recent STEMI 02/23/2022.  He has risk factors including recently discontinue tobacco abuse, treated hypertension, hyperlipidemia and diabetes as well as family history.  He has had left calf claudication for several years.  Recent Doppler studies performed 03/20/2022 revealed a right ABI of 1.0, left of 0.73 with occluded mid left SFA.  He wishes to proceed with outpatient peripheral angiography and endovascular therapy.

## 2022-03-27 NOTE — H&P (View-Only) (Signed)
03/27/2022 KAUAN KLOOSTERMAN   1961-01-19  [id number]  Primary Physician Viona Gilmore, PA-C Primary Cardiologist: Lorretta Harp MD [name], [place]  HPI:  Gene Wright is a 62 y.o. thin-appearing married African-American male father of no children who is accompanied by his wife [name].  He was referred by Finis Bud, NP for evaluation of symptomatic PAD.  He works in a [employer].  His cardiac risk factors are notable for 35 pack years of tobacco abuse having quit smoking cigars at the time of his STEMI.  History of hypertension, hyperlipidemia and diabetes.  His father died of a myocardial infarction at age 20.  He had a STEMI on 02/23/2022 and had circumflex and obtuse marginal branch intervention by Dr. Tamala Julian with scattered moderate disease otherwise and mild to moderate LV dysfunction.  He is on dual antiplatelet therapy.  He no longer has chest pain or shortness of breath.  He does have left calf claudication which she has had for several years thus lifestyle-limiting.  Dopplers performed 03/20/2022 revealed a right ABI of 1.0 and a left of 0.73 with an occluded left SFA.   Current Meds  Medication Sig   acetaminophen (TYLENOL) 325 MG tablet Take 2 tablets (650 mg total) by mouth every 6 (six) hours as needed for headache or mild pain.   aspirin 81 MG tablet Take 81 mg by mouth daily.   atorvastatin (LIPITOR) 80 MG tablet Take 1 tablet (80 mg total) by mouth daily.   buPROPion (WELLBUTRIN SR) 150 MG 12 hr tablet Take 150 mg by mouth 2 (two) times daily.   cetirizine (ZYRTEC) 10 MG tablet Take 1 tablet (10 mg total) by mouth daily.   clopidogrel (PLAVIX) 75 MG tablet Take 1 tablet (75 mg total) by mouth daily.   JARDIANCE 25 MG TABS tablet Take 25 mg by mouth daily.   lisinopril (ZESTRIL) 5 MG tablet Take 1 tablet (5 mg total) by mouth daily.   metoprolol succinate (TOPROL-XL) 25 MG 24 hr tablet Take 1 tablet (25 mg total) by mouth daily.   montelukast  (SINGULAIR) 10 MG tablet Take 1 tablet (10 mg total) by mouth at bedtime.   nitroGLYCERIN (NITROSTAT) 0.4 MG SL tablet Place 1 tablet (0.4 mg total) under the tongue every 5 (five) minutes as needed for chest pain.   omeprazole (PRILOSEC) 40 MG capsule Take 40 mg by mouth daily.   Vitamin D, Ergocalciferol, (DRISDOL) 1.25 MG (50000 UNIT) CAPS capsule Take 50,000 Units by mouth once a week.     Allergies  Allergen Reactions   Biaxin [Clarithromycin] Hives and Itching   Metformin     Makes me sick   Hydroxyzine Itching    Social History   Socioeconomic History   Marital status: Married    Spouse name: Not on file   Number of children: Not on file   Years of education: Not on file   Highest education level: Not on file  Occupational History   Not on file  Tobacco Use   Smoking status: Former    Packs/day: 3.00    Years: 32.00    Total pack years: 96.00    Types: Cigars, Cigarettes    Quit date: 02/23/2022    Years since quitting: 0.0   Smokeless tobacco: Not on file   Tobacco comments:    trying to quit  Vaping Use   Vaping Use: Never used  Substance and Sexual Activity   Alcohol use: Yes  Alcohol/week: 14.0 standard drinks of alcohol    Types: 14 Cans of beer per week    Comment:  1-2 beers a day or every other day   Drug use: No   Sexual activity: Not on file  Other Topics Concern   Not on file  Social History Narrative   Not on file   Social Determinants of Health   Financial Resource Strain: Not on file  Food Insecurity: No Food Insecurity (02/23/2022)   Hunger Vital Sign    Worried About Running Out of Food in the Last Year: Never true    Ran Out of Food in the Last Year: Never true  Transportation Needs: No Transportation Needs (02/23/2022)   PRAPARE - Transportation    Lack of Transportation (Medical): No    Lack of Transportation (Non-Medical): No  Physical Activity: Not on file  Stress: Not on file  Social Connections: Not on file  Intimate  Partner Violence: Not At Risk (02/23/2022)   Humiliation, Afraid, Rape, and Kick questionnaire    Fear of Current or Ex-Partner: No    Emotionally Abused: No    Physically Abused: No    Sexually Abused: No     Review of Systems: General: negative for chills, fever, night sweats or weight changes.  Cardiovascular: negative for chest pain, dyspnea on exertion, edema, orthopnea, palpitations, paroxysmal nocturnal dyspnea or shortness of breath Dermatological: negative for rash Respiratory: negative for cough or wheezing Urologic: negative for hematuria Abdominal: negative for nausea, vomiting, diarrhea, bright red blood per rectum, melena, or hematemesis Neurologic: negative for visual changes, syncope, or dizziness All other systems reviewed and are otherwise negative except as noted above.    Blood pressure 118/80, pulse 79, height 5' 3" (1.6 m), weight 151 lb (68.5 kg).  General appearance: alert and no distress Neck: no adenopathy, no carotid bruit, no JVD, supple, symmetrical, trachea midline, and thyroid not enlarged, symmetric, no tenderness/mass/nodules Lungs: clear to auscultation bilaterally Heart: regular rate and rhythm, S1, S2 normal, no murmur, click, rub or gallop Extremities: extremities normal, atraumatic, no cyanosis or edema Pulses: Diminished left pedal pulse Skin: Skin color, texture, turgor normal. No rashes or lesions Neurologic: Grossly normal  EKG not performed today  ASSESSMENT AND PLAN:   Peripheral arterial disease (HCC) Mr. Besse was referred to me by Elizabeth Peck, NP for evaluation of symptomatic PAD.  He does have a history of CAD status post post recent STEMI 02/23/2022.  He has risk factors including recently discontinue tobacco abuse, treated hypertension, hyperlipidemia and diabetes as well as family history.  He has had left calf claudication for several years.  Recent Doppler studies performed 03/20/2022 revealed a right ABI of 1.0, left of 0.73  with occluded mid left SFA.  He wishes to proceed with outpatient peripheral angiography and endovascular therapy.     Tedd Cottrill J. Nirvi Boehler MD FACP,FACC,FAHA, FSCAI 03/27/2022 2:46 PM 

## 2022-03-27 NOTE — Patient Instructions (Addendum)
Medication Instructions:  The current medical regimen is effective;  continue present plan and medications.  *If you need a refill on your cardiac medications before your next appointment, please call your pharmacy*   Lab Work: CBC, BMET today   If you have labs (blood work) drawn today and your tests are completely normal, you will receive your results only by: [place] (if you have MyChart) OR A paper copy in the mail If you have any lab test that is abnormal or we need to change your treatment, we will call you to review the results.   Testing/Procedures: Your physician has requested that you have a peripheral vascular angiogram. This exam is performed at the hospital. During this exam IV contrast is used to look at arterial blood flow. Please review the information sheet given for details.  Your physician has requested that you have an ankle brachial index (ABI). (1 week post procedure) During this test an ultrasound and blood pressure cuff are used to evaluate the arteries that supply the arms and legs with blood. Allow thirty minutes for this exam. There are no restrictions or special instructions.   Your physician has requested that you have a lower or upper extremity arterial duplex (1 week post procedure). This test is an ultrasound of the arteries in the legs or arms. It looks at arterial blood flow in the legs and arms. Allow one hour for Lower and Upper Arterial scans. There are no restrictions or special instructions   Follow-Up: At [facility], you and your health needs are our priority.  As part of our continuing mission to provide you with exceptional heart care, we have created designated Provider Care Teams.  These Care Teams include your primary Cardiologist (physician) and Advanced Practice Providers (APPs -  Physician Assistants and Nurse Practitioners) who all work together to provide you with the care you need, when you need it.  We recommend signing  up for the patient portal called "MyChart".  Sign up information is provided on this After Visit Summary.  MyChart is used to connect with patients for Virtual Visits (Telemedicine).  Patients are able to view lab/test results, encounter notes, upcoming appointments, etc.  Non-urgent messages can be sent to your provider as well.   To learn more about what you can do with MyChart, go to [email or web address].    Your next appointment:   2 week(s)  Provider:   Quay Burow, MD   Other Instructions  [place] A DEPT OF [place] A DEPT OF [place]. [facility] [place] [id number] [place] [address] Dept: [phone] Loc: [place]  03/27/2022  You are scheduled for a Peripheral Angiogram on Monday, January 29 with Dr. Quay Burow.  1. Please arrive at the [facility] (Main Entrance A) at [facility]: [address] [place], [place] [address] at 12:00 PM (This time is two hours before your procedure to ensure your preparation). Free valet parking service is available.   Special note: Every effort is made to have your procedure done on time. Please understand that emergencies sometimes delay scheduled procedures.  2. Diet: Do not eat solid foods after midnight.  The patient may have clear liquids until 5am upon the day of the procedure.  3. Labs: You will need to have blood drawn today- BMET, CBC. You do not need to be fasting.  4. Medication instructions in preparation  for your procedure:   Contrast Allergy: No  Stop Taking Lisinopril 24 hours before procedure  On the morning of your procedure, take your Aspirin 81 mg and any morning medicines NOT listed above.  You may use sips of water.  5. Plan for one night stay--bring personal belongings. 6. Bring a current list of your medications and current insurance cards. 7. You MUST have a responsible  person to drive you home. 8. Someone MUST be with you the first 24 hours after you arrive home or your discharge will be delayed. 9. Please wear clothes that are easy to get on and off and wear slip-on shoes.  Thank you for allowing [place] to care for you!   -- [facility]

## 2022-03-28 ENCOUNTER — Encounter (HOSPITAL_COMMUNITY): Payer: BLUE CROSS/BLUE SHIELD

## 2022-03-28 LAB — CBC
Hematocrit: 44 % (ref 37.5–51.0)
Hemoglobin: 15.7 g/dL (ref 13.0–17.7)
MCH: 32 pg (ref 26.6–33.0)
MCHC: 35.7 g/dL (ref 31.5–35.7)
MCV: 90 fL (ref 79–97)
Platelets: 267 10*3/uL (ref 150–450)
RBC: 4.91 x10E6/uL (ref 4.14–5.80)
RDW: 11.2 % — ABNORMAL LOW (ref 11.6–15.4)
WBC: 7.4 10*3/uL (ref 3.4–10.8)

## 2022-03-28 LAB — BASIC METABOLIC PANEL
BUN/Creatinine Ratio: 13 (ref 9–20)
BUN: 14 mg/dL (ref 6–24)
CO2: 19 mmol/L — ABNORMAL LOW (ref 20–29)
Calcium: 9.5 mg/dL (ref 8.7–10.2)
Chloride: 105 mmol/L (ref 96–106)
Creatinine, Ser: 1.07 mg/dL (ref 0.76–1.27)
Glucose: 86 mg/dL (ref 70–99)
Potassium: 4.7 mmol/L (ref 3.5–5.2)
Sodium: 142 mmol/L (ref 134–144)
eGFR: 80 mL/min/{1.73_m2} (ref >59)

## 2022-03-30 ENCOUNTER — Encounter (HOSPITAL_COMMUNITY): Payer: BLUE CROSS/BLUE SHIELD

## 2022-03-30 ENCOUNTER — Other Ambulatory Visit: Payer: Self-pay

## 2022-03-30 DIAGNOSIS — I739 Peripheral vascular disease, unspecified: Secondary | ICD-10-CM

## 2022-03-30 MED ORDER — SODIUM CHLORIDE 0.9% FLUSH: 3.0000 mL | Freq: Two times a day (BID) | INTRAVENOUS | Status: DC

## 2022-04-02 ENCOUNTER — Encounter (HOSPITAL_COMMUNITY): Payer: BLUE CROSS/BLUE SHIELD

## 2022-04-04 ENCOUNTER — Encounter (HOSPITAL_COMMUNITY): Payer: BLUE CROSS/BLUE SHIELD

## 2022-04-05 ENCOUNTER — Telehealth: Payer: Self-pay | Admitting: *Deleted

## 2022-04-05 NOTE — Telephone Encounter (Signed)
Abdominal aortogram  scheduled at [facility] for: Monday April 09, 2022 2 PM  Arrival time and place: [place] Entrance A at: 47 Noon   Nothing to eat after midnight prior to procedure, clear liquids until 5 AM day of procedure.  Medication instructions: -Hold:  [place]-AM of procedure -Except hold medications usual morning medications can be taken with sips of water including aspirin 81 mg and Plavix 75 mg  Confirmed patient has responsible adult to drive home post procedure and be with patient first 24 hours after arriving home.  Patient reports no new symptoms concerning for COVID-19 in the past 10 days.  Reviewed procedure instructions with patient.

## 2022-04-06 ENCOUNTER — Encounter (HOSPITAL_COMMUNITY): Payer: BLUE CROSS/BLUE SHIELD

## 2022-04-09 ENCOUNTER — Ambulatory Visit (HOSPITAL_COMMUNITY)
Admission: RE | Admit: 2022-04-09 | Discharge: 2022-04-10 | Disposition: A | Discharge status: Home / Self Care | Payer: BLUE CROSS/BLUE SHIELD | Attending: Cardiovascular Disease | Admitting: Cardiovascular Disease

## 2022-04-09 ENCOUNTER — Ambulatory Visit (HOSPITAL_COMMUNITY)
Admission: RE | Disposition: A | Discharge status: Home / Self Care | Payer: Self-pay | Source: Home / Self Care | Attending: Cardiovascular Disease

## 2022-04-09 ENCOUNTER — Encounter (HOSPITAL_COMMUNITY): Payer: BLUE CROSS/BLUE SHIELD

## 2022-04-09 DIAGNOSIS — Z87891 Personal history of nicotine dependence: Secondary | ICD-10-CM | POA: Insufficient documentation

## 2022-04-09 DIAGNOSIS — I252 Old myocardial infarction: Secondary | ICD-10-CM | POA: Diagnosis not present

## 2022-04-09 DIAGNOSIS — I70212 Atherosclerosis of native arteries of extremities with intermittent claudication, left leg: Secondary | ICD-10-CM | POA: Insufficient documentation

## 2022-04-09 DIAGNOSIS — E1151 Type 2 diabetes mellitus with diabetic peripheral angiopathy without gangrene: Secondary | ICD-10-CM | POA: Diagnosis present

## 2022-04-09 DIAGNOSIS — Z7982 Long term (current) use of aspirin: Secondary | ICD-10-CM | POA: Insufficient documentation

## 2022-04-09 DIAGNOSIS — E785 Hyperlipidemia, unspecified: Secondary | ICD-10-CM | POA: Insufficient documentation

## 2022-04-09 DIAGNOSIS — I10 Essential (primary) hypertension: Secondary | ICD-10-CM | POA: Insufficient documentation

## 2022-04-09 DIAGNOSIS — Z8249 Family history of ischemic heart disease and other diseases of the circulatory system: Secondary | ICD-10-CM | POA: Diagnosis not present

## 2022-04-09 DIAGNOSIS — Z7902 Long term (current) use of antithrombotics/antiplatelets: Secondary | ICD-10-CM | POA: Diagnosis not present

## 2022-04-09 DIAGNOSIS — I739 Peripheral vascular disease, unspecified: Secondary | ICD-10-CM | POA: Diagnosis present

## 2022-04-09 DIAGNOSIS — I708 Atherosclerosis of other arteries: Secondary | ICD-10-CM | POA: Insufficient documentation

## 2022-04-09 HISTORY — PX: ABDOMINAL AORTOGRAM W/LOWER EXTREMITY: CATH118223

## 2022-04-09 LAB — GLUCOSE, CAPILLARY
Glucose-Capillary: 115 mg/dL — ABNORMAL HIGH (ref 70–99)
Glucose-Capillary: 75 mg/dL (ref 70–99)
Glucose-Capillary: 99 mg/dL (ref 70–99)

## 2022-04-09 LAB — POCT ACTIVATED CLOTTING TIME
Activated Clotting Time: 347 seconds
Activated Clotting Time: 277 seconds
Activated Clotting Time: 222 seconds
Activated Clotting Time: 196 seconds
Activated Clotting Time: 174 seconds

## 2022-04-09 SURGERY — ABDOMINAL AORTOGRAM W/LOWER EXTREMITY
Anesthesia: LOCAL

## 2022-04-09 MED ORDER — SODIUM CHLORIDE 0.9 % IV SOLN: 250.0000 mL | INTRAVENOUS | Status: DC | PRN

## 2022-04-09 MED ORDER — CLOPIDOGREL BISULFATE 75 MG PO TABS
75.0000 mg | ORAL_TABLET | Freq: Every day | ORAL | Status: DC
  Administered 2022-04-10: 75 mg via ORAL
  Filled 2022-04-09: qty 1

## 2022-04-09 MED ORDER — SODIUM CHLORIDE 0.9 % WEIGHT BASED INFUSION: 1.0000 mL/kg/h | INTRAVENOUS | Status: DC

## 2022-04-09 MED ORDER — LIDOCAINE HCL (PF) 1 % IJ SOLN
INTRAMUSCULAR | Status: DC | PRN
  Administered 2022-04-09: 15 mL

## 2022-04-09 MED ORDER — LIDOCAINE HCL (PF) 1 % IJ SOLN
INTRAMUSCULAR | Status: AC
  Filled 2022-04-09: qty 30

## 2022-04-09 MED ORDER — ACETAMINOPHEN 325 MG PO TABS
650.0000 mg | ORAL_TABLET | ORAL | Status: DC | PRN
  Administered 2022-04-09: 650 mg via ORAL

## 2022-04-09 MED ORDER — EMPAGLIFLOZIN 25 MG PO TABS
25.0000 mg | ORAL_TABLET | Freq: Every day | ORAL | Status: DC
  Administered 2022-04-10: 25 mg via ORAL
  Filled 2022-04-09: qty 1

## 2022-04-09 MED ORDER — MORPHINE SULFATE (PF) 2 MG/ML IV SOLN: 2.0000 mg | INTRAVENOUS | Status: DC | PRN

## 2022-04-09 MED ORDER — ATORVASTATIN CALCIUM 80 MG PO TABS: 80.0000 mg | ORAL_TABLET | Freq: Every day | ORAL | Status: DC

## 2022-04-09 MED ORDER — CLOPIDOGREL BISULFATE 75 MG PO TABS
75.0000 mg | ORAL_TABLET | Freq: Once | ORAL | Status: AC
  Administered 2022-04-09: 75 mg via ORAL
  Filled 2022-04-09: qty 1

## 2022-04-09 MED ORDER — ACETAMINOPHEN 325 MG PO TABS
ORAL_TABLET | ORAL | Status: AC
  Filled 2022-04-09: qty 2

## 2022-04-09 MED ORDER — HEPARIN SODIUM (PORCINE) 1000 UNIT/ML IJ SOLN
INTRAMUSCULAR | Status: AC
  Filled 2022-04-09: qty 10

## 2022-04-09 MED ORDER — ASPIRIN 81 MG PO CHEW: 81.0000 mg | CHEWABLE_TABLET | ORAL | Status: DC

## 2022-04-09 MED ORDER — ATORVASTATIN CALCIUM 80 MG PO TABS
80.0000 mg | ORAL_TABLET | Freq: Every day | ORAL | Status: DC
  Administered 2022-04-10: 80 mg via ORAL
  Filled 2022-04-09: qty 1

## 2022-04-09 MED ORDER — SODIUM CHLORIDE 0.9 % WEIGHT BASED INFUSION
3.0000 mL/kg/h | INTRAVENOUS | Status: DC
  Administered 2022-04-09: 3 mL/kg/h via INTRAVENOUS

## 2022-04-09 MED ORDER — FENTANYL CITRATE (PF) 100 MCG/2ML IJ SOLN
INTRAMUSCULAR | Status: DC | PRN
  Administered 2022-04-09: 50 ug via INTRAVENOUS
  Administered 2022-04-09 (×2): 25 ug via INTRAVENOUS

## 2022-04-09 MED ORDER — LABETALOL HCL 5 MG/ML IV SOLN: 10.0000 mg | INTRAVENOUS | Status: DC | PRN

## 2022-04-09 MED ORDER — PANTOPRAZOLE SODIUM 40 MG PO TBEC
40.0000 mg | DELAYED_RELEASE_TABLET | Freq: Every day | ORAL | Status: DC
  Administered 2022-04-10: 40 mg via ORAL
  Filled 2022-04-09: qty 1

## 2022-04-09 MED ORDER — SODIUM CHLORIDE 0.9% FLUSH: 3.0000 mL | INTRAVENOUS | Status: DC | PRN

## 2022-04-09 MED ORDER — ASPIRIN 81 MG PO TBEC
81.0000 mg | DELAYED_RELEASE_TABLET | Freq: Every day | ORAL | Status: DC
  Administered 2022-04-10: 81 mg via ORAL
  Filled 2022-04-09: qty 1

## 2022-04-09 MED ORDER — HYDRALAZINE HCL 20 MG/ML IJ SOLN: 5.0000 mg | INTRAMUSCULAR | Status: DC | PRN

## 2022-04-09 MED ORDER — METOPROLOL SUCCINATE ER 25 MG PO TB24
25.0000 mg | ORAL_TABLET | Freq: Every day | ORAL | Status: DC
  Administered 2022-04-10: 25 mg via ORAL
  Filled 2022-04-09: qty 1

## 2022-04-09 MED ORDER — SODIUM CHLORIDE 0.9 % IV SOLN: INTRAVENOUS | Status: AC

## 2022-04-09 MED ORDER — CLOPIDOGREL BISULFATE 75 MG PO TABS: 75.0000 mg | ORAL_TABLET | Freq: Every day | ORAL | Status: DC

## 2022-04-09 MED ORDER — FENTANYL CITRATE (PF) 100 MCG/2ML IJ SOLN
INTRAMUSCULAR | Status: AC
  Filled 2022-04-09: qty 2

## 2022-04-09 MED ORDER — HEPARIN SODIUM (PORCINE) 1000 UNIT/ML IJ SOLN
INTRAMUSCULAR | Status: DC | PRN
  Administered 2022-04-09: 7500 [IU] via INTRAVENOUS

## 2022-04-09 MED ORDER — MIDAZOLAM HCL 2 MG/2ML IJ SOLN
INTRAMUSCULAR | Status: AC
  Filled 2022-04-09: qty 2

## 2022-04-09 MED ORDER — HEPARIN (PORCINE) IN NACL 1000-0.9 UT/500ML-% IV SOLN
INTRAVENOUS | Status: AC
  Filled 2022-04-09: qty 500

## 2022-04-09 MED ORDER — MIDAZOLAM HCL 2 MG/2ML IJ SOLN
INTRAMUSCULAR | Status: DC | PRN
  Administered 2022-04-09 (×2): 1 mg via INTRAVENOUS

## 2022-04-09 MED ORDER — ONDANSETRON HCL 4 MG/2ML IJ SOLN: 4.0000 mg | Freq: Four times a day (QID) | INTRAMUSCULAR | Status: DC | PRN

## 2022-04-09 MED ORDER — LISINOPRIL 5 MG PO TABS
5.0000 mg | ORAL_TABLET | Freq: Every day | ORAL | Status: DC
  Administered 2022-04-10: 5 mg via ORAL
  Filled 2022-04-09: qty 1

## 2022-04-09 MED ORDER — NITROGLYCERIN 0.4 MG SL SUBL: 0.4000 mg | SUBLINGUAL_TABLET | SUBLINGUAL | Status: DC | PRN

## 2022-04-09 MED ORDER — SODIUM CHLORIDE 0.9% FLUSH
3.0000 mL | Freq: Two times a day (BID) | INTRAVENOUS | Status: DC
  Administered 2022-04-09: 3 mL via INTRAVENOUS

## 2022-04-09 MED ORDER — ASPIRIN 81 MG PO TABS: 81.0000 mg | ORAL_TABLET | Freq: Every day | ORAL | Status: DC

## 2022-04-09 SURGICAL SUPPLY — 22 items
CATH ANGIO 5F PIGTAIL 65CM (CATHETERS) IMPLANT
CATH CROSS OVER TEMPO 5F (CATHETERS) IMPLANT
CATH NAVICROSS ST .035X135CM (MICROCATHETER) IMPLANT
CATH VIANCE CROSS STAND 150CM (MICROCATHETER) ×1
CATH VIANCE CROSS STD 150CM (MICROCATHETER) IMPLANT
GLIDEWIRE ANGLED SS 035X260CM (WIRE) IMPLANT
GUIDEWIRE ANGLED .035X150CM (WIRE) IMPLANT
KIT ENCORE 26 ADVANTAGE (KITS) IMPLANT
KIT PV (KITS) ×1 IMPLANT
SHEATH HIGHFLEX ANSEL 7FR 55CM (SHEATH) IMPLANT
SHEATH PINNACLE 5F 10CM (SHEATH) IMPLANT
SHEATH PINNACLE 7F 10CM (SHEATH) IMPLANT
SHEATH PROBE COVER 6X72 (BAG) IMPLANT
STOPCOCK MORSE 400PSI 3WAY (MISCELLANEOUS) IMPLANT
SYR MEDRAD MARK 7 150ML (SYRINGE) ×1 IMPLANT
TAPE SHOOT N SEE (TAPE) IMPLANT
TRANSDUCER W/STOPCOCK (MISCELLANEOUS) ×1 IMPLANT
TRAY PV CATH (CUSTOM PROCEDURE TRAY) ×1 IMPLANT
TUBING CIL FLEX 10 FLL-RA (TUBING) IMPLANT
WIRE HITORQ VERSACORE ST 145CM (WIRE) IMPLANT
WIRE ROSEN-J .035X180CM (WIRE) IMPLANT
WIRE SHEPHERD 6G .014 (WIRE) IMPLANT

## 2022-04-09 NOTE — Interval H&P Note (Signed)
History and Physical Interval Note:  04/09/2022 2:33 PM  Gene Wright  has presented today for surgery, with the diagnosis of pad.  The various methods of treatment have been discussed with the patient and family. After consideration of risks, benefits and other options for treatment, the patient has consented to  Procedure(s): ABDOMINAL AORTOGRAM W/LOWER EXTREMITY (N/A) as a surgical intervention.  The patient's history has been reviewed, patient examined, no change in status, stable for surgery.  I have reviewed the patient's chart and labs.  Questions were answered to the patient's satisfaction.     Quay Burow

## 2022-04-09 NOTE — Progress Notes (Addendum)
Site area: Right groin a 7 french arterial sheath was removed  Site Prior to Removal:  Level 0  Pressure Applied For 40 MINUTES    Bedrest Beginning at 1950 X 6 hours  Manual:   Yes.    Patient Status During Pull:  stable  Post Pull Groin Site:  Level 0  Post Pull Instructions Given:  Yes.    Post Pull Pulses Present:  Yes.    Dressing Applied:  Yes.    Comments:

## 2022-04-10 ENCOUNTER — Encounter (HOSPITAL_COMMUNITY): Payer: Self-pay | Admitting: Cardiovascular Disease

## 2022-04-10 DIAGNOSIS — E1151 Type 2 diabetes mellitus with diabetic peripheral angiopathy without gangrene: Secondary | ICD-10-CM | POA: Diagnosis not present

## 2022-04-10 DIAGNOSIS — I739 Peripheral vascular disease, unspecified: Secondary | ICD-10-CM

## 2022-04-10 LAB — BASIC METABOLIC PANEL
Anion gap: 7 (ref 5–15)
BUN: 10 mg/dL (ref 6–20)
CO2: 22 mmol/L (ref 22–32)
Calcium: 8.6 mg/dL — ABNORMAL LOW (ref 8.9–10.3)
Chloride: 108 mmol/L (ref 98–111)
Creatinine, Ser: 0.97 mg/dL (ref 0.61–1.24)
GFR, Estimated: >60 mL/min (ref >60)
Glucose, Bld: 189 mg/dL — ABNORMAL HIGH (ref 70–99)
Potassium: 3.5 mmol/L (ref 3.5–5.1)
Sodium: 137 mmol/L (ref 135–145)

## 2022-04-10 LAB — CBC
HCT: 42.3 % (ref 39.0–52.0)
Hemoglobin: 14.6 g/dL (ref 13.0–17.0)
MCH: 32 pg (ref 26.0–34.0)
MCHC: 34.5 g/dL (ref 30.0–36.0)
MCV: 92.8 fL (ref 80.0–100.0)
Platelets: 207 10*3/uL (ref 150–400)
RBC: 4.56 MIL/uL (ref 4.22–5.81)
RDW: 12.3 % (ref 11.5–15.5)
WBC: 6.4 10*3/uL (ref 4.0–10.5)
nRBC: 0 % (ref 0.0–0.2)

## 2022-04-10 LAB — LIPID PANEL
Cholesterol: 97 mg/dL (ref 0–200)
HDL: 27 mg/dL — ABNORMAL LOW (ref >40)
LDL Cholesterol: 39 mg/dL (ref 0–99)
Total CHOL/HDL Ratio: 3.6 RATIO
Triglycerides: 153 mg/dL — ABNORMAL HIGH (ref <150)
VLDL: 31 mg/dL (ref 0–40)

## 2022-04-10 MED ORDER — PANTOPRAZOLE SODIUM 40 MG PO TBEC: 40.0000 mg | DELAYED_RELEASE_TABLET | Freq: Every day | ORAL | 1 refills | Status: DC

## 2022-04-10 MED FILL — Heparin Sod (Porcine)-NaCl IV Soln 1000 Unit/500ML-0.9%: INTRAVENOUS | Qty: 1000 | Status: AC

## 2022-04-10 NOTE — Progress Notes (Signed)
D/c tele and iv. Went over AVS with pt and all questions were addressed.   Fin Hupp S Omesha Bowerman, RN  

## 2022-04-10 NOTE — Progress Notes (Signed)
Right groin had oozed through 4x4 small soft bruise. Not oozing whe n dressing removed and new one applied.

## 2022-04-10 NOTE — Progress Notes (Signed)
Mobility Specialist Progress Note:   04/10/22 1000  Mobility  Activity Ambulated with assistance in hallway  Level of Assistance Independent after set-up  Assistive Device None  Distance Ambulated (ft) 500 ft  Activity Response Tolerated well  $Mobility charge 1 Mobility   Pt received in bed willing to participate in mobility. No complaints of pain. Left in bed with call bell in reach and all needs met.   Gene Wright Gene Wright Mobility Specialist Please contact via [employer] or  Rehab Office at [phone]

## 2022-04-10 NOTE — Discharge Summary (Signed)
Discharge Summary    Patient ID: Gene Wright MRN: [id number]; DOB: 09-Mar-1961  Admit date: 04/09/2022 Discharge date: 04/10/2022  PCP:  Gene Mouton, PA-C   [facility] Providers Cardiologist:  Gene Bicker, MD      Discharge Diagnoses    Principal Problem:   Claudication in peripheral vascular disease [facility])   Diagnostic Studies/Procedures    PV angiography: 04/09/22  Procedure Description:Contralateral access was obtained with a crossover catheter, 3 5 Glidewire and a 7 [place] 55 cm multipurpose Ansell sheath.  He received 7500's of heparin with an ACT of 247.   I attempted to cross the CTO with a 6 g 01 for Shepperd wire and AV on CTO catheter unsuccessfully.  The proximal Was difficult to navigate since there were 2 collaterals that arose from.  I then Gene out for 135 cm 035 Nava cross and all catheter along with a stiff angled Glidewire.  I was subintimal and was unable to reenter the true lumen beyond the distal And aborted the procedure.   Final Impression: Gene Wright has a long mid left SFA CTO and asymptomatic with lifestyle-limiting claudication.  Unfortunately I was unable to cross this because of inability to reenter the distal lumen.  The procedure was aborted.  The straight 7 French sheath will be removed once ACT falls below 170 pressure will be held.  He is already on aspirin and clopidogrel because of the recent STEMI.  He will be hydrated overnight, discharged home in the morning and will see him back in the office in 1 to 2 weeks at which time we will discuss his therapeutic options.   Gene Wright. MD, [facility] 04/09/2022 4:14 PM _____________   History of Present Illness     Gene Wright is a 62 y.o. male with past medical history of hypertension, hyperlipidemia, diabetes, recent STEMI with PCI of circumflex and OM, mild to moderate LV dysfunction who was referred to Gene Wright for claudication and found to have a right ABI  of 1.0 and left of 0.73 with an occluded left SFA.  Given his abnormal ABIs he was set up for outpatient peripheral angiography.  Hospital Course     He presented on 1/29 with attempted PV angiography with long mild left SFA CTO.  Procedure was aborted due to inability to cross with difficulty entering the distal lumen.  He was continued on aspirin and Wright given his recent STEMI.  He was observed overnight without complications.  He will return to the office to see Gene Wright to discuss therapeutic options. Gene Wright.   General: Well developed, well nourished, male appearing in no acute distress. Head: Normocephalic, atraumatic.  Neck: Supple without bruits, JVD. Lungs:  Resp regular and unlabored, CTA. Heart: RRR, S1, S2, no S3, S4, or murmur; no rub. Abdomen: Soft, non-tender, non-distended with normoactive bowel sounds. No hepatomegaly. No rebound/guarding. No obvious abdominal masses. Extremities: No clubbing, cyanosis, edema. Distal pedal pulses are 2+ bilaterally. Right femoral cath site stable without bruising or hematoma Neuro: Alert and oriented X 3. Moves all extremities spontaneously. Psych: Normal affect.    Did the patient have an acute coronary syndrome (MI, NSTEMI, STEMI, etc) this admission?:  No                               Did the patient have a percutaneous coronary  intervention (stent / angioplasty)?:  No.          _____________  Discharge Vitals Blood pressure (!) 139/91, pulse 82, temperature 98.2 F (36.8 C), temperature source Oral, resp. rate 18, height 5\' 3"  (1.6 m), weight 71.6 kg, SpO2 96 %.  Filed Weights   04/09/22 1223 04/10/22 0545  Weight: 68 kg 71.6 kg    Labs & Radiologic Studies    CBC Recent Labs    04/10/22 0201  WBC 6.4  HGB 14.6  HCT 42.3  MCV 92.8  PLT 546   Basic Metabolic Panel Recent Labs    04/10/22 0201  NA 137  K 3.5  CL 108  CO2 22   GLUCOSE 189*  BUN 10  CREATININE 0.97  CALCIUM 8.6*   Liver Function Tests No results for input(s): "AST", "ALT", "ALKPHOS", "BILITOT", "PROT", "ALBUMIN" in the last 72 hours. No results for input(s): "LIPASE", "AMYLASE" in the last 72 hours. High Sensitivity Troponin:   No results for input(s): "TROPONINIHS" in the last 720 hours.  BNP Invalid input(s): "POCBNP" D-Dimer No results for input(s): "DDIMER" in the last 72 hours. Hemoglobin A1C No results for input(s): "HGBA1C" in the last 72 hours. Fasting Lipid Panel Recent Labs    04/10/22 0201  CHOL 97  HDL 27*  LDLCALC 39  TRIG 153*  CHOLHDL 3.6   Thyroid Function Tests No results for input(s): "TSH", "T4TOTAL", "T3FREE", "THYROIDAB" in the last 72 hours.  Invalid input(s): "FREET3" _____________  PERIPHERAL VASCULAR CATHETERIZATION  Result Date: 04/10/2022 Images from the original result were not included.  [id number] LOCATION:  FACILITY: [facility] PHYSICIAN: Gene Wright, M.D. [birth year] DATE OF PROCEDURE:  04/09/2022 DATE OF DISCHARGE: PV Angiogram/Intervention History obtained from chart review.Gene Wright is a 62 y.o. thin-appearing married African-American male father of no children who is accompanied by his wife Gene Wright.  He was referred by Gene Bud, Gene Wright for evaluation of symptomatic PAD.  He works in a [employer].  His cardiac risk factors are notable for 35 pack years of tobacco abuse having quit smoking cigars at the time of his STEMI.  History of hypertension, hyperlipidemia and diabetes.  His father died of a myocardial infarction at age 72.  He had a STEMI on 02/23/2022 and had circumflex and obtuse marginal branch intervention by Dr. Tamala Julian with scattered moderate disease otherwise and mild to moderate LV dysfunction.  He is on dual antiplatelet therapy.  He no longer has chest pain or shortness of breath.  He does have left calf claudication which she has had for several years thus lifestyle-limiting.   Dopplers performed 03/20/2022 revealed a right ABI of 1.0 and a left of 0.73 with an occluded left SFA.  He presents today for outpatient peripheral angiogram and potential endovascular therapy for lifestyle-limiting claudication.  Pre Procedure Diagnosis: Peripheral arterial disease Post Procedure Diagnosis: Peripheral arterial disease Operators: Dr. Quay Wright Procedures Performed:  1.  Ultrasound-guided right common femoral access  2.  Abdominal aortogram/bilateral iliac angiogram/bifemoral runoff  3.  Contralateral access  4.  Failed attempt at left SFA CTO intervention PROCEDURE DESCRIPTION: The patient was brought to the second floor [facility] cath lab in the the postabsorptive state. He was premedicated with IV Versed and fentanyl. His right groin was prepped and shaved in usual sterile fashion. Xylocaine 1% was used for local anesthesia. A 5 French sheath was inserted into the right common femoral artery using standard Seldinger technique.  A 5 French pigtail catheter was placed in distal  abdominal aorta.  Distal abdominal aortography, bilateral iliac angiography with bifemoral runoff was performed using bolus chase, digital subtraction and step table technique.  Omnipaque dye was used for the entirety of the case (total contrast administered to patient 230 cc).  Retrograde aortic pressures monitored during the case.  Angiographic Data: 1: Abdominal aorta-the renal arteries widely patent.  The right renal artery apparently originated from the distal aorta. 2: Left lower extremity-40 to 50% ostial left common iliac artery stenosis, 70-80% segmental ostial/proximal left SFA stenosis followed by a long CTO reconstituting the adductor canal by profunda femoris collaterals.  There was three-vessel runoff although the anterior tibial artery became diminutive to the level of the ankle.   Gene Wright has a long proximal/mid left SFA CTO and is symptomatic.  Will proceed with attempted percutaneous  revascularization. Procedure Description:Contralateral access was obtained with a crossover catheter, 3 5 Glidewire and a 7 [place] 55 cm multipurpose Ansell sheath.  He received 7500's of heparin with an ACT of 247. I attempted to cross the CTO with a 6 g 01 for Shepperd wire and AV on CTO catheter unsuccessfully.  The proximal Was difficult to navigate since there were 2 collaterals that arose from.  I then Gene out for 135 cm 035 Nava cross and all catheter along with a stiff angled Glidewire.  I was subintimal and was unable to reenter the true lumen beyond the distal And aborted the procedure. Final Impression: Gene Wright has a long mid left SFA CTO and asymptomatic with lifestyle-limiting claudication.  Unfortunately I was unable to cross this because of inability to reenter the distal lumen.  The procedure was aborted.  The straight 7 French sheath will be removed once ACT falls below 170 pressure will be held.  He is already on aspirin and clopidogrel because of the recent STEMI.  He will be hydrated overnight, discharged home in the morning and will see him back in the office in 1 to 2 weeks at which time we will discuss his therapeutic options. Gene Wright. MD, [facility] 04/09/2022 4:14 PM    VAS [place] LOWER EXTREMITY ARTERIAL DUPLEX  Result Date: 03/21/2022 LOWER EXTREMITY ARTERIAL DUPLEX STUDY Patient Name:  Gene Wright  Date of Exam:   03/20/2022 Medical Rec #: [id number]             Accession #:    [id number] Date of Birth: [birth year]             Patient Gender: M Patient Age:   57 years Exam Location:  [place] Procedure:      VAS [place] LOWER EXTREMITY ARTERIAL DUPLEX Referring Phys: Sharlene Dory --------------------------------------------------------------------------------  Indications: Claudication. High Risk Factors: Hypertension, hyperlipidemia, Diabetes, past history of                    smoking, coronary artery disease. Other Factors: Patient complain of pain in his left calf and foot  everytime he                starts walking that has been getting worse over the last three                months.  Current ABI: right-1.o, Left-0.73 Performing Technologist: Jake Seats RDMS, RVT, RDCS  Examination Guidelines: A complete evaluation includes B-mode imaging, spectral Doppler, color Doppler, and power Doppler as needed of all accessible portions of each vessel. Bilateral testing is considered an integral part of a complete examination. Limited examinations for reoccurring indications may  be performed as noted.  +-----------+--------+-----+--------+---------+--------+ RIGHT      PSV cm/sRatioStenosisWaveform Comments +-----------+--------+-----+--------+---------+--------+ CFA Prox   105                  triphasic         +-----------+--------+-----+--------+---------+--------+ CFA Distal 63                   biphasic          +-----------+--------+-----+--------+---------+--------+ DFA        87                   triphasic         +-----------+--------+-----+--------+---------+--------+ SFA Prox   91                   biphasic          +-----------+--------+-----+--------+---------+--------+ SFA Mid    114                  biphasic          +-----------+--------+-----+--------+---------+--------+ SFA Distal 218                  biphasic          +-----------+--------+-----+--------+---------+--------+ POP Prox   99                   biphasic          +-----------+--------+-----+--------+---------+--------+ POP Distal 69                   biphasic          +-----------+--------+-----+--------+---------+--------+ TP Trunk   68                   biphasic          +-----------+--------+-----+--------+---------+--------+ ATA Prox   46                                     +-----------+--------+-----+--------+---------+--------+ ATA Mid    37                                      +-----------+--------+-----+--------+---------+--------+ ATA Distal 27                                     +-----------+--------+-----+--------+---------+--------+ PTA Prox   38                                     +-----------+--------+-----+--------+---------+--------+ PTA Mid                 occluded                  +-----------+--------+-----+--------+---------+--------+ PTA Distal              occluded                  +-----------+--------+-----+--------+---------+--------+ PERO Prox  69                                     +-----------+--------+-----+--------+---------+--------+ PERO Mid  33                                     +-----------+--------+-----+--------+---------+--------+ PERO Distal26                                     +-----------+--------+-----+--------+---------+--------+  +-----------+--------+-----+--------+----------+--------+ LEFT       PSV cm/sRatioStenosisWaveform  Comments +-----------+--------+-----+--------+----------+--------+ CFA Prox   99                   triphasic          +-----------+--------+-----+--------+----------+--------+ CFA Distal 76                   triphasic          +-----------+--------+-----+--------+----------+--------+ DFA        127                  triphasic          +-----------+--------+-----+--------+----------+--------+ SFA Prox   78                   triphasic          +-----------+--------+-----+--------+----------+--------+ SFA Mid                 occluded                   +-----------+--------+-----+--------+----------+--------+ SFA Distal 29                   monophasic         +-----------+--------+-----+--------+----------+--------+ POP Prox   32                   monophasic         +-----------+--------+-----+--------+----------+--------+ POP Distal 33                   monophasic          +-----------+--------+-----+--------+----------+--------+ TP Trunk   25                   monophasic         +-----------+--------+-----+--------+----------+--------+ ATA Mid    14                                      +-----------+--------+-----+--------+----------+--------+ ATA Distal 12                                      +-----------+--------+-----+--------+----------+--------+ PTA Prox   49                                      +-----------+--------+-----+--------+----------+--------+ PTA Mid    24                                      +-----------+--------+-----+--------+----------+--------+ PTA Distal 24                                      +-----------+--------+-----+--------+----------+--------+  PERO Prox  16                                      +-----------+--------+-----+--------+----------+--------+ PERO Mid   12                                      +-----------+--------+-----+--------+----------+--------+ PERO Distal11                                      +-----------+--------+-----+--------+----------+--------+  Summary: Right: 50-74% stenosis noted in the superficial femoral artery. Total occlusion noted in the posterior tibial artery. Left: Total occlusion noted in the superficial femoral artery. See Arterial Doppler study.  See table(s) above for measurements and observations. Suggest Peripheral Vascular Consult. Electronically signed by Larae Grooms MD on 03/21/2022 at 12:42:56 PM.    Final    VAS [place] LOWER EXT ART SEG MULTI (SEGMENTALS & LE RAYNAUDS)  Result Date: 03/21/2022  LOWER EXTREMITY DOPPLER STUDY Patient Name:  Gene Wright  Date of Exam:   03/20/2022 Medical Rec #: [id number]             Accession #:    [id number] Date of Birth: Nov 18, 1960             Patient Gender: M Patient Age:   42 years Exam Location:  [place] Procedure:      VAS [place] LOWER EXT ART SEG MULTI (SEGMENTALS & LE RAYNAUDS) Referring Phys: Gene Wright  --------------------------------------------------------------------------------  Indications: Claudication. High Risk Factors: Hypertension, hyperlipidemia, Diabetes, past history of                    smoking, coronary artery disease. Other Factors: Patient complain of pain in his left calf and foot everytime he                starts walking that has been getting worse over the last three                months.  Performing Technologist: McFatter, Leafy Ro RVT  Examination Guidelines: A complete evaluation includes at minimum, Doppler waveform signals and systolic blood pressure reading at the level of bilateral brachial, anterior tibial, and posterior tibial arteries, when vessel segments are accessible. Bilateral testing is considered an integral part of a complete examination. Photoelectric Plethysmograph (PPG) waveforms and toe systolic pressure readings are included as required and additional duplex testing as needed. Limited examinations for reoccurring indications may be performed as noted.  ABI Findings: +---------+------------------+-----+---------+--------+ Right    Rt Pressure (mmHg)IndexWaveform Comment  +---------+------------------+-----+---------+--------+ Brachial 120                    biphasic          +---------+------------------+-----+---------+--------+ CFA                             triphasic         +---------+------------------+-----+---------+--------+ Popliteal                       triphasic         +---------+------------------+-----+---------+--------+ ATA      97  0.81 biphasic          +---------+------------------+-----+---------+--------+ PTA      111               0.92 biphasic          +---------+------------------+-----+---------+--------+ PERO     120               1.00 biphasic          +---------+------------------+-----+---------+--------+ Great Toe80                0.67 Normal             +---------+------------------+-----+---------+--------+ +---------+------------------+-----+----------+-------+ Left     Lt Pressure (mmHg)IndexWaveform  Comment +---------+------------------+-----+----------+-------+ Brachial 116                    biphasic          +---------+------------------+-----+----------+-------+ CFA                             triphasic         +---------+------------------+-----+----------+-------+ Popliteal                       monophasic        +---------+------------------+-----+----------+-------+ ATA      88                0.73 monophasic        +---------+------------------+-----+----------+-------+ PTA      87                0.72 monophasic        +---------+------------------+-----+----------+-------+ PERO     65                0.54 monophasic        +---------+------------------+-----+----------+-------+ Great Toe58                0.48 Abnormal          +---------+------------------+-----+----------+-------+ +-------+-----------+-----------+------------+------------+ ABI/TBIToday's ABIToday's TBIPrevious ABIPrevious TBI +-------+-----------+-----------+------------+------------+ Right  1.0        0.67                                +-------+-----------+-----------+------------+------------+ Left   0.73       0.48                                +-------+-----------+-----------+------------+------------+  See Arterial Duplex study  Summary: Right: Resting right ankle-brachial index is within normal range. The right toe-brachial index is abnormal. Left: Resting left ankle-brachial index indicates moderate left lower extremity arterial disease. The left toe-brachial index is abnormal. *See table(s) above for measurements and observations.  Suggest Peripheral Vascular Consult. Electronically signed by Lance MussJayadeep Varanasi MD on 03/21/2022 at 12:41:08 PM.    Final    Disposition   Pt is being discharged home today in  good condition.  Follow-up Plans & Appointments     Follow-up Information     Runell GessBerry, Jonathan J, MD Follow up on 04/25/2022.   Specialties: Cardiology, Radiology Why: at 2:30pm for your follow up appt Contact information: [address] [place] [address] [phone]                   Discharge Medications   Allergies as of 04/10/2022  Reactions   Biaxin [clarithromycin] Hives, Itching   Hydroxyzine Itching   Metformin Nausea And Vomiting        Medication List     STOP taking these medications    omeprazole 40 MG capsule Commonly known as: PRILOSEC Replaced by: pantoprazole 40 MG tablet       TAKE these medications    acetaminophen 325 MG tablet Commonly known as: TYLENOL Take 2 tablets (650 mg total) by mouth every 6 (six) hours as needed for headache or mild pain.   aspirin 81 MG tablet Take 81 mg by mouth daily.   atorvastatin 80 MG tablet Commonly known as: LIPITOR Take 1 tablet (80 mg total) by mouth daily.   buPROPion 150 MG 12 hr tablet Commonly known as: WELLBUTRIN SR Take 150 mg by mouth 2 (two) times daily.   cetirizine 10 MG tablet Commonly known as: ZYRTEC Take 10 mg by mouth daily as needed for allergies.   clopidogrel 75 MG tablet Commonly known as: Wright Take 1 tablet (75 mg total) by mouth daily. What changed: when to take this   Jardiance 25 MG Tabs tablet Generic drug: empagliflozin Take 25 mg by mouth daily with breakfast.   lisinopril 5 MG tablet Commonly known as: ZESTRIL Take 1 tablet (5 mg total) by mouth daily.   metoprolol succinate 25 MG 24 hr tablet Commonly known as: TOPROL-XL Take 1 tablet (25 mg total) by mouth daily. What changed: when to take this   nitroGLYCERIN 0.4 MG SL tablet Commonly known as: NITROSTAT Place 1 tablet (0.4 mg total) under the tongue every 5 (five) minutes as needed for chest pain.   pantoprazole 40 MG tablet Commonly known as: PROTONIX Take 1  tablet (40 mg total) by mouth daily. Replaces: omeprazole 40 MG capsule   Vitamin D (Ergocalciferol) 1.25 MG (50000 UNIT) Caps capsule Commonly known as: DRISDOL Take 50,000 Units by mouth every Wednesday.        Outstanding Labs/Studies   N/a   Duration of Discharge Encounter   Greater than 30 minutes including physician time.  Signed, Reino Bellis, Gene Wright 04/10/2022, 9:22 AM

## 2022-04-11 ENCOUNTER — Encounter (HOSPITAL_COMMUNITY): Payer: BLUE CROSS/BLUE SHIELD

## 2022-04-13 ENCOUNTER — Encounter (HOSPITAL_COMMUNITY): Payer: BLUE CROSS/BLUE SHIELD

## 2022-04-16 ENCOUNTER — Encounter (HOSPITAL_COMMUNITY): Payer: BLUE CROSS/BLUE SHIELD

## 2022-04-18 ENCOUNTER — Encounter (HOSPITAL_COMMUNITY): Payer: BLUE CROSS/BLUE SHIELD

## 2022-04-20 ENCOUNTER — Encounter (HOSPITAL_COMMUNITY): Payer: BLUE CROSS/BLUE SHIELD

## 2022-04-23 ENCOUNTER — Encounter (HOSPITAL_COMMUNITY): Payer: BLUE CROSS/BLUE SHIELD

## 2022-04-23 ENCOUNTER — Encounter (HOSPITAL_COMMUNITY): Payer: Self-pay | Admitting: Cardiovascular Disease

## 2022-04-25 ENCOUNTER — Encounter (HOSPITAL_COMMUNITY): Payer: BLUE CROSS/BLUE SHIELD

## 2022-04-25 ENCOUNTER — Encounter: Payer: Self-pay | Admitting: Cardiovascular Disease

## 2022-04-25 ENCOUNTER — Ambulatory Visit: Payer: BLUE CROSS/BLUE SHIELD | Attending: Cardiovascular Disease | Admitting: Cardiovascular Disease

## 2022-04-25 VITALS — BP 132/84 | HR 97 | Ht 63.5 in | Wt 152.2 lb

## 2022-04-25 DIAGNOSIS — I739 Peripheral vascular disease, unspecified: Secondary | ICD-10-CM

## 2022-04-25 NOTE — Progress Notes (Signed)
Mr. Gene Wright returns today with his wife Gene Wright after his PV angiogram which I performed 04/09/2022 for claudication.  He had a total left SFA.  I was unable to completely cross and reenter the true lumen and aborted the procedure.  He still has mild claudication.  We talked about tibial pedal access but unfortunately his insurance company in [place] will not pay for procedure [employer].  Interestingly, he did stop smoking at the time of his STEMI on 02/23/2022.  His lipid profile is below goal.  We talked about Pletal but he was more interested in percutaneous revascularization.  He was going to talk to his insurance company to resolve payment issues.  Otherwise, I will see him back as needed.  Lorretta Harp, M.D., [place], [facility], [place] [address] [place], [place]  [address]  [phone] 04/25/2022 2:52 PM

## 2022-04-25 NOTE — Patient Instructions (Signed)
Medication Instructions:  Your physician recommends that you continue on your current medications as directed. Please refer to the Current Medication list given to you today.  *If you need a refill on your cardiac medications before your next appointment, please call your pharmacy*   Follow-Up: At [facility], you and your health needs are our priority.  As part of our continuing mission to provide you with exceptional heart care, we have created designated Provider Care Teams.  These Care Teams include your primary Cardiologist (physician) and Advanced Practice Providers (APPs -  Physician Assistants and Nurse Practitioners) who all work together to provide you with the care you need, when you need it.  We recommend signing up for the patient portal called "MyChart".  Sign up information is provided on this After Visit Summary.  MyChart is used to connect with patients for Virtual Visits (Telemedicine).  Patients are able to view lab/test results, encounter notes, upcoming appointments, etc.  Non-urgent messages can be sent to your provider as well.   To learn more about what you can do with MyChart, go to [email or web address].    Your next appointment:   We will see you on an as needed basis.  Provider:   Quay Burow, MD

## 2022-04-27 ENCOUNTER — Encounter (HOSPITAL_COMMUNITY): Payer: BLUE CROSS/BLUE SHIELD

## 2022-04-30 ENCOUNTER — Encounter (HOSPITAL_COMMUNITY): Payer: BLUE CROSS/BLUE SHIELD

## 2022-05-02 ENCOUNTER — Encounter (HOSPITAL_COMMUNITY): Payer: BLUE CROSS/BLUE SHIELD

## 2022-05-04 ENCOUNTER — Encounter (HOSPITAL_COMMUNITY): Payer: BLUE CROSS/BLUE SHIELD

## 2022-05-07 ENCOUNTER — Encounter (HOSPITAL_COMMUNITY): Payer: BLUE CROSS/BLUE SHIELD

## 2022-05-07 ENCOUNTER — Encounter: Payer: Self-pay | Admitting: Nurse Practitioner

## 2022-05-07 ENCOUNTER — Ambulatory Visit: Payer: BLUE CROSS/BLUE SHIELD | Attending: Nurse Practitioner | Admitting: Nurse Practitioner

## 2022-05-07 VITALS — BP 138/80 | HR 83 | Ht 63.0 in | Wt 159.2 lb

## 2022-05-07 DIAGNOSIS — I10 Essential (primary) hypertension: Secondary | ICD-10-CM

## 2022-05-07 DIAGNOSIS — I255 Ischemic cardiomyopathy: Secondary | ICD-10-CM

## 2022-05-07 DIAGNOSIS — I739 Peripheral vascular disease, unspecified: Secondary | ICD-10-CM

## 2022-05-07 DIAGNOSIS — I2510 Atherosclerotic heart disease of native coronary artery without angina pectoris: Secondary | ICD-10-CM

## 2022-05-07 DIAGNOSIS — E785 Hyperlipidemia, unspecified: Secondary | ICD-10-CM | POA: Diagnosis not present

## 2022-05-07 DIAGNOSIS — E118 Type 2 diabetes mellitus with unspecified complications: Secondary | ICD-10-CM

## 2022-05-07 NOTE — Patient Instructions (Addendum)
Medication Instructions:   Your physician recommends that you continue on your current medications as directed. Please refer to the Current Medication list given to you today.  Labwork:  none  Testing/Procedures:  none  Follow-Up:  Your physician recommends that you schedule a follow-up appointment in: 3 months.  Any Other Special Instructions Will Be Listed Below (If Applicable).  If you need a refill on your cardiac medications before your next appointment, please call your pharmacy. 

## 2022-05-07 NOTE — Progress Notes (Unsigned)
Cardiology Office Note:    Date:  05/09/2022   ID:  NAKYE BRANER, DOB 1960/03/18, MRN [id number]  PCP:  Viona Gilmore, [place] [facility] Cardiologist:  Chalmers Guest, MD     Referring MD: Viona Gilmore, PA-C   CC: Here for follow-up   History of Present Illness:    Gene Wright is a 62 y.o. male with a hx of the following:  CAD, s/p PCI with 2 overlapping DES in dLCx and into OM3 (02/2022) T2DM COPD HTN HLD CKD stage 3a Memory loss [place] kidney Former smoker History of alcohol abuse Insomnia   Previous cardiac history includes stress test in 2022 that was negative for ischemia.  Test was stopped due to severe hypertension during testing.  Does have a positive family cardiac history for CAD, father had a heart attack in the past.  Patient is a very delightful 62 year old male. Transferred from [facility] in 02/2022  to [facility] for evaluation of inferior STEMI as seen on EKG.  [facility] 02/23/2022 evealed occlusion of distal Cx, treated with PCI and overlapping stenting in the dCx and into the third OM, crossing over the distal circumflex.  50% distal left main, ramus intermedius with ostial to proximal 70% narrowing, RCA with segmental 70% narrowing in mid vessel, mild mid inferior wall hypokinesis, EF 55%, LVEDP 20 mmHg, hemodynamics were consistent with acute diastolic heart failure.  Recommendations included aggressive risk factor modification, consideration of SGLT2 therapy.  DAPT including ASA and Brilinta included, beta-blocker therapy, ARB therapy, smoking cessation recommended, including PAD workup.   During hospitalization, TTE revealed EF 45 to 50%, left ventricle demonstrated RWMA, mild LVH, normal PASP, no significant valvular abnormalities. BP was soft.  Lisinopril dosage was reduced to 5 mg once daily, beta-blocker changed to Toprol-XL 25 mg once daily.  It was recommended if his blood pressure increases, home  dose of lisinopril could be resumed at office follow-up visit.  At follow-up visit, was also recommended he needed PAD workup.  Due to cost of Brilinta, patient would need to be transitioned to Plavix after 30 days at his follow-up visit.    03/07/2022 - Denied any anginal pain; admitted to rare episodes of tingling sensation in between his shoulder blades, rare. Noted episode of SHOB and HR was around 140's transient, didn't last long, hasn't recurred since, did admit to caffeine use around that time. Having claudication, more noticed as a pressure along his left calf muscle.   05/07/2022 - Doing the same. Denies any chest pain. Breathing is normal. Denies any chest pain, shortness of breath, palpitations, syncope, presyncope, dizziness, orthopnea, PND, swelling or significant weight changes, or acute bleeding. Tolerating medications well. Still notes claudication.   SH: Works for [employer]  Past Medical History:  Diagnosis Date   CAD (coronary artery disease) 02/25/2022   S/p Inf STEMI 02/2022 >> s/p overlapping DES (2.25 x 20 mm Synergy and 2 x 12 mm Synergy) to the distal LCx and into the OM3 Residual CAD: dLM 38, ost and prox RI 70, mRCA 70   Colon polyps    Constipation    Diabetes mellitus without complication ([place])    Heart failure with mildly reduced ejection fraction (HFmrEF) ([place]) 02/25/2022   Ischemic CM // TTE 02/23/22: EF 45-50, ant-lat and post HK, NL RVSF, NL PASP (RVSP 23.2), RAP 8   Hypercholesteremia    Hypertension     Past Surgical History:  Procedure Laterality Date   ABDOMINAL AORTOGRAM  W/LOWER EXTREMITY N/A 04/09/2022   Procedure: ABDOMINAL AORTOGRAM W/LOWER EXTREMITY;  Surgeon: Lorretta Harp, MD;  Location: [facility];  Service: Cardiovascular;  Laterality: N/A;   CIRCUMCISION     COLONOSCOPY     COLONOSCOPY N/A 09/09/2013   Procedure: COLONOSCOPY;  Surgeon: Rogene Houston, MD;  Location: [facility];  Service: Endoscopy;  Laterality: N/A;  930    CORONARY STENT INTERVENTION N/A 02/23/2022   Procedure: CORONARY STENT INTERVENTION;  Surgeon: Belva Crome, MD;  Location: [facility];  Service: Cardiovascular;  Laterality: N/A;   LEFT HEART CATH AND CORONARY ANGIOGRAPHY N/A 02/23/2022   Procedure: LEFT HEART CATH AND CORONARY ANGIOGRAPHY;  Surgeon: Belva Crome, MD;  Location: [facility];  Service: Cardiovascular;  Laterality: N/A;   Current Meds  Medication Sig   acetaminophen (TYLENOL) 325 MG tablet Take 2 tablets (650 mg total) by mouth every 6 (six) hours as needed for headache or mild pain.   aspirin 81 MG tablet Take 81 mg by mouth daily.   atorvastatin (LIPITOR) 80 MG tablet Take 1 tablet (80 mg total) by mouth daily.   buPROPion (WELLBUTRIN SR) 150 MG 12 hr tablet Take 150 mg by mouth 2 (two) times daily.   cetirizine (ZYRTEC) 10 MG tablet Take 10 mg by mouth daily as needed for allergies.   clopidogrel (PLAVIX) 75 MG tablet Take 1 tablet (75 mg total) by mouth daily. (Patient taking differently: Take 75 mg by mouth at bedtime.)   JARDIANCE 25 MG TABS tablet Take 25 mg by mouth daily with breakfast.   lisinopril (ZESTRIL) 5 MG tablet Take 1 tablet (5 mg total) by mouth daily.   metoprolol succinate (TOPROL-XL) 25 MG 24 hr tablet Take 1 tablet (25 mg total) by mouth daily. (Patient taking differently: Take 25 mg by mouth at bedtime.)   nitroGLYCERIN (NITROSTAT) 0.4 MG SL tablet Place 1 tablet (0.4 mg total) under the tongue every 5 (five) minutes as needed for chest pain.   pantoprazole (PROTONIX) 40 MG tablet Take 1 tablet (40 mg total) by mouth daily.   Vitamin D, Ergocalciferol, (DRISDOL) 1.25 MG (50000 UNIT) CAPS capsule Take 50,000 Units by mouth every Wednesday.   Allergies:   Biaxin [clarithromycin], Hydroxyzine, and Metformin   Social History   Socioeconomic History   Marital status: Married    Spouse name: Not on file   Number of children: Not on file   Years of education: Not on file   Highest  education level: Not on file  Occupational History   Not on file  Tobacco Use   Smoking status: Former    Packs/day: 3.00    Years: 32.00    Total pack years: 96.00    Types: Cigars, Cigarettes    Quit date: 02/23/2022    Years since quitting: 0.2   Smokeless tobacco: Not on file   Tobacco comments:    trying to quit  Vaping Use   Vaping Use: Never used  Substance and Sexual Activity   Alcohol use: Yes    Alcohol/week: 14.0 standard drinks of alcohol    Types: 14 Cans of beer per week    Comment:  1-2 beers a day or every other day   Drug use: No   Sexual activity: Not on file  Other Topics Concern   Not on file  Social History Narrative   Not on file   Social Determinants of Health   Financial Resource Strain: Not on file  Food Insecurity: No Food Insecurity (02/23/2022)   Hunger Vital Sign    Worried About Running Out of Food in the Last Year: Never true    Ran Out of Food in the Last Year: Never true  Transportation Needs: No Transportation Needs (02/23/2022)   PRAPARE - Hydrologist (Medical): No    Lack of Transportation (Non-Medical): No  Physical Activity: Not on file  Stress: Not on file  Social Connections: Not on file     Family History: The patient's family history includes Cancer in his paternal grandfather; Coronary artery disease in his father; Diabetes in his maternal grandfather, maternal grandmother, paternal grandfather, and paternal grandmother. There is no history of Colon cancer.  ROS:     Please see the history of present illness.    All other systems reviewed and are negative.  EKGs/Labs/Other Studies Reviewed:    The following studies were reviewed today:   EKG:  EKG is not ordered today.   2D echocardiogram on 02/23/2022: 1. Left ventricular ejection fraction, by estimation, is 45 to 50%. The  left ventricle has mildly decreased function. The left ventricle  demonstrates regional wall motion  abnormalities (see scoring  diagram/findings for description). There is mild left  ventricular hypertrophy. Left ventricular diastolic parameters are  indeterminate.   2. Right ventricular systolic function is normal. The right ventricular  size is normal. There is normal pulmonary artery systolic pressure. The  estimated right ventricular systolic pressure is [id number] mmHg.   3. The mitral valve is normal in structure. No evidence of mitral valve  regurgitation. No evidence of mitral stenosis.   4. The aortic valve is tricuspid. Aortic valve regurgitation is not  visualized. No aortic stenosis is present.   5. The inferior vena cava is dilated in size with >50% respiratory  variability, suggesting right atrial pressure of 8 mmHg.    Left heart cath and coronary angiography on February 23, 2022:   3rd Mrg lesion is 60% stenosed.   Post intervention, there is a 0% residual stenosis.   CONCLUSIONS: Occlusion of distal circumflex treated with PCI and overlapping stenting in the distal circumflex and into the third obtuse marginal, crossing over the distal circumflex.  0% stenosis was noted post procedure with TIMI grade III flow.  100% stenosis with TIMI grade 0 flow was noted before PCI.  Stents were postdilated to 3.0 mm in diameter proximally. 50% distal left main Ramus intermedius with ostial to proximal 70% narrowing RCA with segmental 70% narrowing in the mid vessel Mild mid inferior wall hypokinesis.  LVEDP 20 mmHg.  EF 55%.  Hemodynamics are consistent with acute diastolic heart failure   RECOMMENDATIONS: Aggressive risk factor modification, LDL less than 70, consider SGLT2 therapy,.  Dual antiplatelet therapy with aspirin and Brilinta, beta-blocker therapy, ARB therapy, and smoking cessation. Needs PAD workup.  Left leg greater than right claudication. Encouraged the patient to stop smoking.   Recent Labs: 02/23/2022: Magnesium 2.1 02/25/2022: ALT 34 04/10/2022: BUN 10; Creatinine,  Ser 0.97; Hemoglobin 14.6; Platelets 207; Potassium 3.5; Sodium 137  Recent Lipid Panel    Component Value Date/Time   CHOL 97 04/10/2022 0201   TRIG 153 (H) 04/10/2022 0201   HDL 27 (L) 04/10/2022 0201   CHOLHDL 3.6 04/10/2022 0201   VLDL 31 04/10/2022 0201   LDLCALC 39 04/10/2022 0201   Physical Exam:    VS:  BP 138/80   Pulse 83   Ht '5\' 3"'$  (1.6 m)  Wt 159 lb 3.2 oz (72.2 kg)   SpO2 96%   BMI 28.20 kg/m     Wt Readings from Last 3 Encounters:  05/07/22 159 lb 3.2 oz (72.2 kg)  04/25/22 152 lb 3.2 oz (69 kg)  04/10/22 157 lb 14.4 oz (71.6 kg)     GEN: Well nourished, well developed in no acute distress HEENT: Normal NECK: No JVD; No carotid bruits CARDIAC: S1/S2, RRR, no murmurs, rubs, gallops; 2+ peripheral pulses  RESPIRATORY:  Clear and diminished to auscultation without rales, wheezing or rhonchi  MUSCULOSKELETAL:  No edema; No deformity  SKIN: Warm and dry NEUROLOGIC:  Alert and oriented x 3 PSYCHIATRIC:  Normal affect   ASSESSMENT:    1. Coronary artery disease involving native heart without angina pectoris, unspecified vessel or lesion type   2. Ischemic cardiomyopathy   3. Hyperlipidemia, unspecified hyperlipidemia type   4. Hypertension, unspecified type   5. Type 2 diabetes mellitus with complication, without long-term current use of insulin (HCC)   6. Claudication in peripheral vascular disease ([place])   7. Peripheral arterial disease (HCC)     PLAN:    In order of problems listed above:  CAD, s/p STEMI with PCI with 2 overlapping DES in dLCx and into OM3 Stable with no recent anginal symptoms. No indication for ischemic evaluation.  Continue aspirin, Lipitor, Plavix, lisinopril, Toprol-XL, and NG PRN. Heart healthy diet and regular cardiovascular exercise encouraged.      2.  Ischemic cardiomyopathy Follow-up echocardiogram after heart cath revealed EF mildly reduced at 45 to 50%, regional wall motion abnormalities were demonstrated along left  ventricle, mild LVH.  Continue current medications. Plan to update Echo in 3 months post cath per protocol. Low sodium diet, fluid restriction <2L, and daily weights encouraged. Educated to contact our office for weight gain of 2 lbs overnight or 5 lbs in one week.  3. HLD Lipid profile during hospitalization revealed total cholesterol 161, HDL 31, LDL 86, and triglycerides 218.  Continue atorvastatin. Heart healthy diet and regular cardiovascular exercise encouraged.   4. HTN Blood pressure today stable.  Denies any BP issues at home. Discussed to monitor BP at home at least 2 hours after medications and sitting for 5-10 minutes.  Continue current medication regimen. Heart healthy diet and regular cardiovascular exercise encouraged.   5. T2DM Previous hemoglobin A1c 6.5%.  Continue Jardiance.  Currently being managed by PCP. Heart healthy, diabetic diet and regular cardiovascular exercise encouraged. Continue to follow with PCP.  6. Claudication, PAD Does admit to stable claudication symptoms (L>R), particularly noticed along left lower extremity. Recent abdominal aortogram with Dr. Gwenlyn Found 03/2022 showed 40 to 50% ostial left common iliac cardiac stenosis of left lower extremity, 70 to 80% segmental ostial/proximal left SFA stenosis followed by long CTO reconstituting abductor canal by profunda femoris collaterals.  Dr. Gwenlyn Found attempted to do percutaneous revascularization, however was unsuccessful.  Follow-up with Dr. Gwenlyn Found as scheduled.  Continue current medication regimen. Heart healthy diet and regular cardiovascular exercise encouraged.   7.  Disposition: Follow-up with Dr. Dellia Cloud in 3 months or sooner if anything changes.  Medication Adjustments/Labs and Tests Ordered: Current medicines are reviewed at length with the patient today.  Concerns regarding medicines are outlined above.  No orders of the defined types were placed in this encounter.  No orders of the defined types were placed  in this encounter.   Patient Instructions  Medication Instructions:  Your physician recommends that you continue on your current medications as  directed. Please refer to the Current Medication list given to you today.   Labwork: none  Testing/Procedures: none  Follow-Up:  Your physician recommends that you schedule a follow-up appointment in: 3 months  Any Other Special Instructions Will Be Listed Below (If Applicable).   If you need a refill on your cardiac medications before your next appointment, please call your pharmacy.    Signed, Finis Bud, NP  05/09/2022 1:04 PM    [place]

## 2022-05-09 ENCOUNTER — Encounter (HOSPITAL_COMMUNITY): Payer: BLUE CROSS/BLUE SHIELD

## 2022-05-11 ENCOUNTER — Encounter (HOSPITAL_COMMUNITY): Payer: BLUE CROSS/BLUE SHIELD

## 2022-05-14 ENCOUNTER — Encounter (HOSPITAL_COMMUNITY): Payer: BLUE CROSS/BLUE SHIELD

## 2022-05-16 ENCOUNTER — Encounter (HOSPITAL_COMMUNITY): Payer: BLUE CROSS/BLUE SHIELD

## 2022-05-17 ENCOUNTER — Telehealth: Payer: Self-pay | Admitting: Nurse Practitioner

## 2022-05-17 NOTE — Telephone Encounter (Signed)
patient is scheduled for an Echocardiogram on 06/06/2022. Per [employer] this patient's policy does not have out of network benefits. [facility] physicians and facilities are not covered under the policy. The echo will not be authorized, the patient will need to have the exam administered through an in-network facility.  05/17/2022 Spoke with Gene Wright . He is going to contact his PCP and see if they can recommend a doctor or facility in [place]. He will call the office back.

## 2022-05-18 ENCOUNTER — Encounter (HOSPITAL_COMMUNITY): Payer: BLUE CROSS/BLUE SHIELD

## 2022-05-21 ENCOUNTER — Encounter (HOSPITAL_COMMUNITY): Payer: BLUE CROSS/BLUE SHIELD

## 2022-05-23 ENCOUNTER — Encounter (HOSPITAL_COMMUNITY): Payer: BLUE CROSS/BLUE SHIELD

## 2022-05-25 ENCOUNTER — Encounter (HOSPITAL_COMMUNITY): Payer: BLUE CROSS/BLUE SHIELD

## 2022-05-28 ENCOUNTER — Encounter (HOSPITAL_COMMUNITY): Payer: BLUE CROSS/BLUE SHIELD

## 2022-05-30 ENCOUNTER — Encounter (HOSPITAL_COMMUNITY): Payer: BLUE CROSS/BLUE SHIELD

## 2022-06-01 ENCOUNTER — Encounter (HOSPITAL_COMMUNITY): Payer: BLUE CROSS/BLUE SHIELD

## 2022-06-04 ENCOUNTER — Encounter (HOSPITAL_COMMUNITY): Payer: BLUE CROSS/BLUE SHIELD

## 2022-06-05 ENCOUNTER — Other Ambulatory Visit: Payer: Self-pay | Admitting: Cardiology

## 2022-06-06 ENCOUNTER — Other Ambulatory Visit: Payer: BLUE CROSS/BLUE SHIELD

## 2022-06-06 ENCOUNTER — Encounter (HOSPITAL_COMMUNITY): Payer: BLUE CROSS/BLUE SHIELD

## 2022-06-08 ENCOUNTER — Encounter (HOSPITAL_COMMUNITY): Payer: BLUE CROSS/BLUE SHIELD

## 2022-06-11 ENCOUNTER — Encounter (HOSPITAL_COMMUNITY): Payer: BLUE CROSS/BLUE SHIELD

## 2022-06-13 ENCOUNTER — Encounter (HOSPITAL_COMMUNITY): Payer: BLUE CROSS/BLUE SHIELD

## 2022-06-19 ENCOUNTER — Encounter: Payer: Self-pay | Admitting: Nurse Practitioner

## 2022-08-04 ENCOUNTER — Other Ambulatory Visit: Payer: Self-pay | Admitting: Internal Medicine

## 2022-08-07 ENCOUNTER — Ambulatory Visit: Payer: BLUE CROSS/BLUE SHIELD | Admitting: Internal Medicine

## 2022-09-02 ENCOUNTER — Other Ambulatory Visit: Payer: Self-pay | Admitting: Internal Medicine

## 2022-09-30 ENCOUNTER — Other Ambulatory Visit: Payer: Self-pay | Admitting: Internal Medicine

## 2022-10-08 ENCOUNTER — Telehealth: Payer: Self-pay

## 2022-10-08 NOTE — Telephone Encounter (Signed)
[employer] insurance sent e-mail stating patient has remained on PPI  for at least 90 days in the past 6 months.They are requesting feedback as they state many patients only require treatment for 4-8 weeks.   I will forward to Dr.Mallipeddi for review.

## 2022-10-12 NOTE — Telephone Encounter (Signed)
Patient notified to d/c PPI- pt had no further questions or concerns at this time.

## 2022-10-12 NOTE — Addendum Note (Signed)
Addended by: Leonides Schanz C on: 10/12/2022 11:23 AM   Modules accepted: Orders

## 2023-03-24 ENCOUNTER — Other Ambulatory Visit: Payer: Self-pay | Admitting: Nurse Practitioner

## 2023-08-14 ENCOUNTER — Other Ambulatory Visit: Payer: Self-pay | Admitting: Internal Medicine

## 2023-09-08 ENCOUNTER — Other Ambulatory Visit: Payer: Self-pay | Admitting: Internal Medicine

## 2023-09-21 ENCOUNTER — Other Ambulatory Visit: Payer: Self-pay | Admitting: Internal Medicine

## 2024-01-28 ENCOUNTER — Other Ambulatory Visit: Payer: Self-pay | Admitting: Nurse Practitioner
